# Patient Record
Sex: Female | Born: 1955 | Smoking: Current every day smoker
Health system: Southern US, Community
[De-identification: ages and names within clinical notes are randomized; demographics above are authoritative.]

## PROBLEM LIST (undated history)

## (undated) DIAGNOSIS — Z6826 Body mass index (BMI) 26.0-26.9, adult: Secondary | ICD-10-CM

## (undated) DIAGNOSIS — I050 Rheumatic mitral stenosis: Secondary | ICD-10-CM

## (undated) DIAGNOSIS — J189 Pneumonia, unspecified organism: Secondary | ICD-10-CM

## (undated) DIAGNOSIS — M1990 Unspecified osteoarthritis, unspecified site: Secondary | ICD-10-CM

## (undated) DIAGNOSIS — R519 Headache, unspecified: Secondary | ICD-10-CM

## (undated) DIAGNOSIS — E7800 Pure hypercholesterolemia, unspecified: Secondary | ICD-10-CM

## (undated) DIAGNOSIS — T8859XA Other complications of anesthesia, initial encounter: Secondary | ICD-10-CM

## (undated) DIAGNOSIS — R112 Nausea with vomiting, unspecified: Secondary | ICD-10-CM

## (undated) HISTORY — DX: Rheumatic mitral stenosis: I05.0

## (undated) HISTORY — DX: Pure hypercholesterolemia, unspecified: E78.00

## (undated) HISTORY — PX: TUBAL LIGATION: SHX77

## (undated) HISTORY — DX: Body mass index (BMI) 26.0-26.9, adult: Z68.26

## (undated) HISTORY — PX: CYST REMOVAL NECK: SHX6281

---

## 1981-05-15 DIAGNOSIS — H349 Unspecified retinal vascular occlusion: Secondary | ICD-10-CM

## 1981-05-15 HISTORY — DX: Unspecified retinal vascular occlusion: H34.9

## 1998-01-13 ENCOUNTER — Encounter: Payer: Self-pay | Admitting: Emergency Medicine

## 1998-01-13 ENCOUNTER — Emergency Department (HOSPITAL_COMMUNITY): Admission: EM | Admit: 1998-01-13 | Discharge: 1998-01-13 | Payer: Self-pay | Admitting: Emergency Medicine

## 1998-02-21 ENCOUNTER — Emergency Department (HOSPITAL_COMMUNITY): Admission: EM | Admit: 1998-02-21 | Discharge: 1998-02-21 | Payer: Self-pay | Admitting: Internal Medicine

## 2001-05-15 HISTORY — PX: OTHER SURGICAL HISTORY: SHX169

## 2001-10-03 ENCOUNTER — Inpatient Hospital Stay (HOSPITAL_COMMUNITY): Admission: AC | Admit: 2001-10-03 | Discharge: 2001-10-08 | Payer: Self-pay

## 2001-10-22 ENCOUNTER — Encounter: Payer: Self-pay | Admitting: Orthopedic Surgery

## 2001-10-22 ENCOUNTER — Encounter (INDEPENDENT_AMBULATORY_CARE_PROVIDER_SITE_OTHER): Payer: Self-pay | Admitting: Specialist

## 2001-10-22 ENCOUNTER — Ambulatory Visit (HOSPITAL_BASED_OUTPATIENT_CLINIC_OR_DEPARTMENT_OTHER): Admission: RE | Admit: 2001-10-22 | Discharge: 2001-10-22 | Payer: Self-pay | Admitting: Orthopedic Surgery

## 2002-03-17 ENCOUNTER — Encounter: Admission: RE | Admit: 2002-03-17 | Discharge: 2002-06-15 | Payer: Self-pay | Admitting: Orthopedic Surgery

## 2003-04-30 ENCOUNTER — Emergency Department (HOSPITAL_COMMUNITY): Admission: EM | Admit: 2003-04-30 | Discharge: 2003-04-30 | Payer: Self-pay | Admitting: Emergency Medicine

## 2003-12-13 ENCOUNTER — Emergency Department (HOSPITAL_COMMUNITY): Admission: EM | Admit: 2003-12-13 | Discharge: 2003-12-13 | Payer: Self-pay | Admitting: Emergency Medicine

## 2005-02-10 ENCOUNTER — Emergency Department (HOSPITAL_COMMUNITY): Admission: EM | Admit: 2005-02-10 | Discharge: 2005-02-10 | Payer: Self-pay | Admitting: Family Medicine

## 2009-04-02 ENCOUNTER — Emergency Department (HOSPITAL_COMMUNITY): Admission: EM | Admit: 2009-04-02 | Discharge: 2009-04-03 | Payer: Self-pay | Admitting: Emergency Medicine
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
## 2015-03-26 ENCOUNTER — Emergency Department (HOSPITAL_COMMUNITY)
Admission: EM | Admit: 2015-03-26 | Discharge: 2015-03-26 | Disposition: A | Discharge status: Home / Self Care | Payer: Self-pay | Attending: Emergency Medicine | Admitting: Emergency Medicine

## 2015-03-26 ENCOUNTER — Encounter (HOSPITAL_COMMUNITY): Payer: Self-pay | Admitting: Emergency Medicine

## 2015-03-26 ENCOUNTER — Emergency Department (HOSPITAL_COMMUNITY): Payer: Self-pay

## 2015-03-26 DIAGNOSIS — M7989 Other specified soft tissue disorders: Secondary | ICD-10-CM

## 2015-03-26 DIAGNOSIS — M79645 Pain in left finger(s): Secondary | ICD-10-CM

## 2015-03-26 DIAGNOSIS — R2232 Localized swelling, mass and lump, left upper limb: Secondary | ICD-10-CM | POA: Insufficient documentation

## 2015-03-26 DIAGNOSIS — Z8639 Personal history of other endocrine, nutritional and metabolic disease: Secondary | ICD-10-CM | POA: Insufficient documentation

## 2015-03-26 DIAGNOSIS — Z720 Tobacco use: Secondary | ICD-10-CM | POA: Insufficient documentation

## 2015-03-26 DIAGNOSIS — L539 Erythematous condition, unspecified: Secondary | ICD-10-CM | POA: Insufficient documentation

## 2015-03-26 HISTORY — DX: Pure hypercholesterolemia, unspecified: E78.00

## 2015-03-26 MED ORDER — TRAMADOL HCL 50 MG PO TABS: 50.0000 mg | ORAL_TABLET | Freq: Four times a day (QID) | ORAL | Status: DC | PRN

## 2015-03-26 MED ORDER — CEPHALEXIN 500 MG PO CAPS: 500.0000 mg | ORAL_CAPSULE | Freq: Four times a day (QID) | ORAL | Status: DC

## 2015-03-26 NOTE — Discharge Instructions (Signed)
Take the prescribed medication as directed. Monitor for signs of increased infection-- increased redness, swelling, streaking of arm, high fever, etc. Follow-up with your primary care physician in 2 days for re-check. Return to the ED for new or worsening symptoms.

## 2015-03-26 NOTE — ED Notes (Signed)
Patient states her L 4th finger felt like it "had to pop".   Patient states now the finger is sore and swelling.   Patient denies injury.

## 2015-03-26 NOTE — ED Provider Notes (Signed)
CSN: [id number]     Arrival date & time 03/26/15  0911 History  By signing my name below, I, Elizabeth Francis, attest that this documentation has been prepared under the direction and in the presence of non-physician practitioner, Quincy Carnes, PA-C. Electronically Signed: Evelene Francis, Scribe. 03/26/2015. 10:22 AM.  Chief Complaint  Patient presents with  . Hand Pain    The history is provided by the patient. No language interpreter was used.    HPI Comments:  Elizabeth Francis is a 59 y.o. female who presents to the Emergency Department complaining of moderate pain to her left ring finger with associated mild swelling and erythema at the site for 2 days. She denies injury, and h/o DM. No alleviating factors noted. Pt also denies fever. States finger feels like "it needs to pop".  Denies jam type injury.  Patient states she is concerned she may have been bitten by a spider but states she did not see anything directly bite her.  Patient is right hand dominant.  No hx of gout, DM.  Prior right hand surgeries, none on left.  VSS.  PCP: Helene Kelp in [place], [place] Hand Surgeon: Marcelino Scot  Past Medical History  Diagnosis Date  . Hypercholesterolemia    History reviewed. No pertinent past surgical history. No family history on file. Social History  Substance Use Topics  . Smoking status: Current Every Day Smoker -- 1.00 packs/day    Types: Cigarettes  . Smokeless tobacco: None  . Alcohol Use: No   OB History    No data available     Review of Systems  Constitutional: Negative for fever and chills.  Respiratory: Negative for shortness of breath.   Cardiovascular: Negative for chest pain.  Musculoskeletal: Positive for arthralgias.  Skin: Positive for color change (erythema).  All other systems reviewed and are negative.  Allergies  Review of patient's allergies indicates no known allergies.  Home Medications   Prior to Admission medications   Not on File   BP 118/64 mmHg  Pulse 77   Temp(Src) 98 F (36.7 C) (Oral)  Resp 18  Ht 5\' 1"  (1.549 m)  Wt 165 lb (74.844 kg)  BMI 31.19 kg/m2  SpO2 97%   Physical Exam  Constitutional: She is oriented to person, place, and time. She appears well-developed and well-nourished.  HENT:  Head: Normocephalic and atraumatic.  Mouth/Throat: Oropharynx is clear and moist.  Eyes: Conjunctivae and EOM are normal. Pupils are equal, round, and reactive to light.  Neck: Normal range of motion.  Cardiovascular: Normal rate, regular rhythm and normal heart sounds.   Pulmonary/Chest: Effort normal and breath sounds normal.  Musculoskeletal: Normal range of motion.       Left hand: She exhibits tenderness and swelling. She exhibits normal range of motion, normal two-point discrimination, normal capillary refill, no deformity and no laceration. Normal sensation noted. Normal strength noted.       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  Skin: Skin is warm and dry.  Psychiatric: She has a normal mood and affect.  Nursing note and vitals reviewed.  ED Course  Procedures   DIAGNOSTIC STUDIES:  Oxygen Saturation is 97% on RA, normal by my interpretation.    COORDINATION OF CARE:  10:17 AM Pt updated with XR results. Will discharge with antibiotics. Discussed treatment plan with pt at bedside and pt agreed to plan.  Labs Review Labs Reviewed - No data to display  Imaging Review Dg Finger Ring Left  03/26/2015  CLINICAL DATA:  Left ring finger pain and swelling. EXAM: LEFT RING FINGER 2+V COMPARISON:   None. FINDINGS: There is mild soft tissue swelling about the ring finger PIP joint. No acute fracture, dislocation, lytic or blastic osseous lesion, or radiopaque foreign body is seen. Joint space widths are preserved. Numerous surgical clips are partially visualized projecting over the thumb/thenar region on the lateral image. IMPRESSION: Soft tissue swelling about the left ring finger PIP joint without acute osseous abnormality identified. Electronically Signed   By: Logan Bores M.D.   On: 03/26/2015 10:08   I have personally reviewed and evaluated these images as part of my medical decision-making.    MDM   Final diagnoses:  Finger pain, left  Finger swelling   59 y.o. F with dorsal left 4th finger pain/swelling.  Denies injury, trauma, open wounds.  States she does have concern for insect bite, however this was not visualized and there are no bites on exam.  There is swelling, tenderness, and erythema of left fourth PIP joint along dorsal aspect. She is able to fully flex and extend finger without difficulty. There is no streaking of the finger or hand.  Patient afebrile, non-toxic.  X-ray negative for acute findings.  At this time do not suspect septic joint, infectious flexor tendosynovitis, or gout but she may be developing a superficial infection/cellulitis.  Will start on abx and tramadol.  Patient states she does not have the money to follow-up with her hand surgeon, Dr. Marcelino Scot, currently but she will have her PCP re-check finger on Monday.  Discussed plan with patient, he/she acknowledged understanding and agreed with plan of care.  Return precautions given for new or worsening symptoms.  I personally performed the services described in this documentation, which was scribed in my presence. The recorded information has been reviewed and is accurate.  Larene Pickett, PA-C 03/26/15 [place], MD 03/26/15 1925

## 2016-11-05 IMAGING — DX DG FINGER RING 2+V*L*
3 series · 3 of 3 positions shown · non-contrast
Comparison: None.

CLINICAL DATA: Left ring finger pain and swelling.

EXAM:
LEFT RING FINGER 2+V

[finger ap]
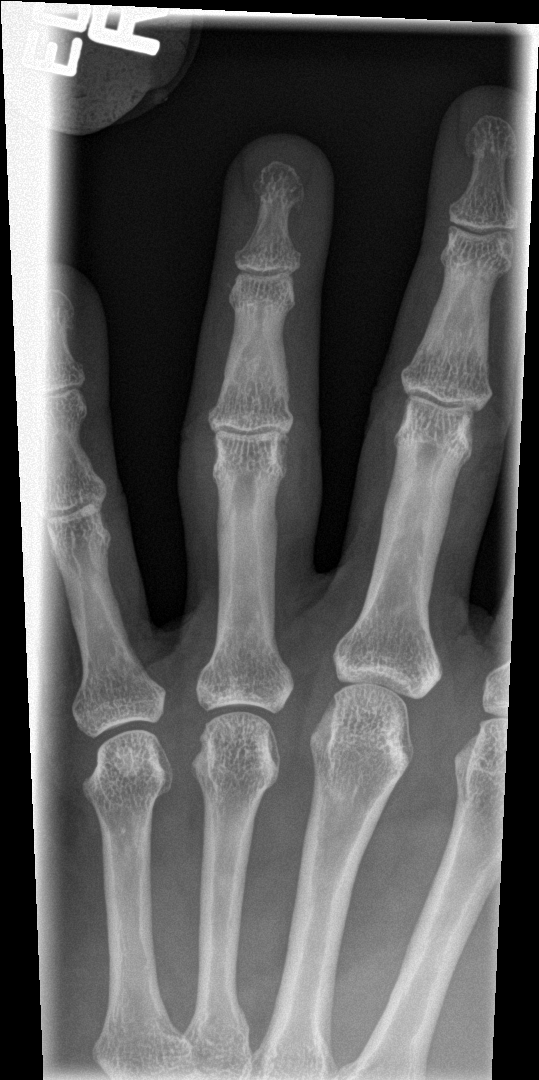

[finger obl]
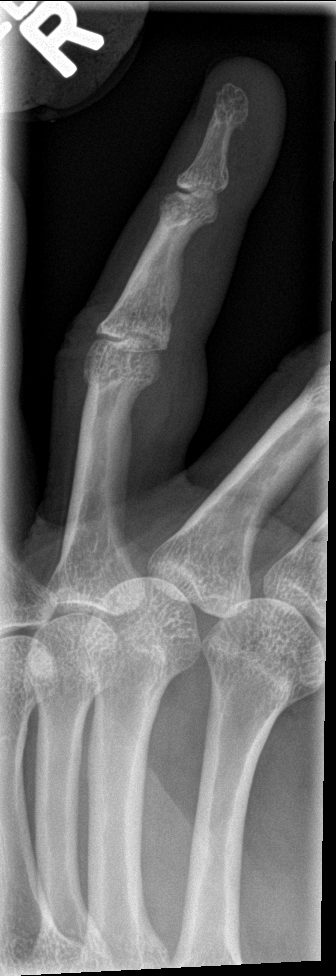

[finger lat]
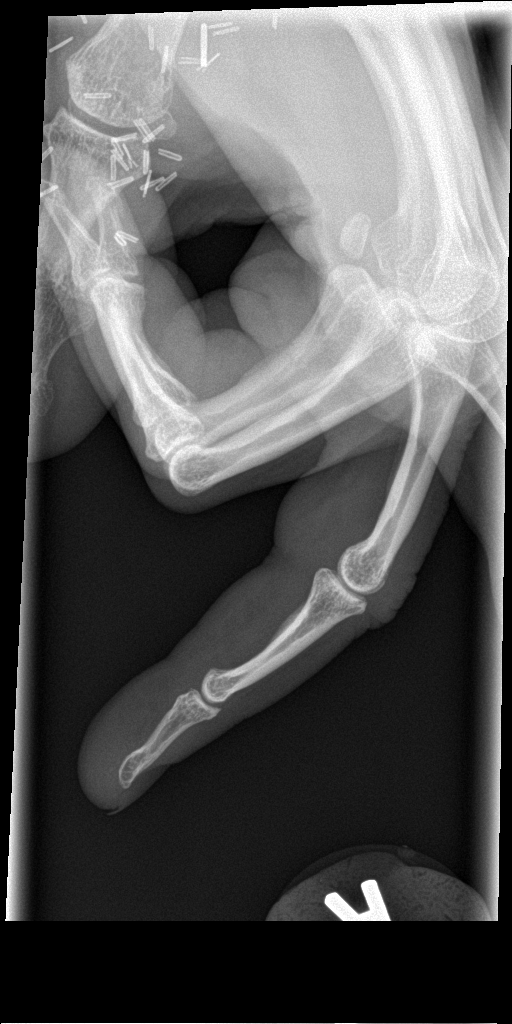

[3 of 3 positions shown; findings below may reference images not displayed]

FINDINGS: There is mild soft tissue swelling about the ring finger PIP joint.
No acute fracture, dislocation, lytic or blastic osseous lesion, or
radiopaque foreign body is seen. Joint space widths are preserved.
Numerous surgical clips are partially visualized projecting over the
thumb/thenar region on the lateral image.
IMPRESSION: Soft tissue swelling about the left ring finger PIP joint without
acute osseous abnormality identified.

## 2021-05-15 HISTORY — PX: PARTIAL KNEE ARTHROPLASTY: SHX2174

## 2021-09-22 DIAGNOSIS — E7800 Pure hypercholesterolemia, unspecified: Secondary | ICD-10-CM | POA: Diagnosis not present

## 2021-09-22 DIAGNOSIS — M899 Disorder of bone, unspecified: Secondary | ICD-10-CM | POA: Diagnosis not present

## 2021-09-22 DIAGNOSIS — M8580 Other specified disorders of bone density and structure, unspecified site: Secondary | ICD-10-CM | POA: Diagnosis not present

## 2021-09-22 DIAGNOSIS — M25562 Pain in left knee: Secondary | ICD-10-CM | POA: Diagnosis not present

## 2021-09-22 DIAGNOSIS — Z0000 Encounter for general adult medical examination without abnormal findings: Secondary | ICD-10-CM | POA: Diagnosis not present

## 2021-09-22 DIAGNOSIS — M8589 Other specified disorders of bone density and structure, multiple sites: Secondary | ICD-10-CM | POA: Diagnosis not present

## 2021-09-22 DIAGNOSIS — Z6826 Body mass index (BMI) 26.0-26.9, adult: Secondary | ICD-10-CM | POA: Diagnosis not present

## 2021-10-13 DIAGNOSIS — M1711 Unilateral primary osteoarthritis, right knee: Secondary | ICD-10-CM | POA: Diagnosis not present

## 2021-10-13 DIAGNOSIS — M25562 Pain in left knee: Secondary | ICD-10-CM | POA: Diagnosis not present

## 2021-12-13 DIAGNOSIS — Z6826 Body mass index (BMI) 26.0-26.9, adult: Secondary | ICD-10-CM | POA: Diagnosis not present

## 2021-12-13 DIAGNOSIS — S0502XA Injury of conjunctiva and corneal abrasion without foreign body, left eye, initial encounter: Secondary | ICD-10-CM | POA: Diagnosis not present

## 2021-12-19 DIAGNOSIS — H40812 Glaucoma with increased episcleral venous pressure, left eye: Secondary | ICD-10-CM | POA: Diagnosis not present

## 2021-12-19 DIAGNOSIS — H02825 Cysts of left lower eyelid: Secondary | ICD-10-CM | POA: Diagnosis not present

## 2022-02-02 DIAGNOSIS — H02825 Cysts of left lower eyelid: Secondary | ICD-10-CM | POA: Diagnosis not present

## 2022-02-02 DIAGNOSIS — H2513 Age-related nuclear cataract, bilateral: Secondary | ICD-10-CM | POA: Diagnosis not present

## 2022-02-02 DIAGNOSIS — H43812 Vitreous degeneration, left eye: Secondary | ICD-10-CM | POA: Diagnosis not present

## 2022-03-06 DIAGNOSIS — R252 Cramp and spasm: Secondary | ICD-10-CM | POA: Diagnosis not present

## 2022-03-06 DIAGNOSIS — Z6826 Body mass index (BMI) 26.0-26.9, adult: Secondary | ICD-10-CM | POA: Diagnosis not present

## 2022-03-21 DIAGNOSIS — D485 Neoplasm of uncertain behavior of skin: Secondary | ICD-10-CM | POA: Diagnosis not present

## 2022-03-21 DIAGNOSIS — H04203 Unspecified epiphora, bilateral lacrimal glands: Secondary | ICD-10-CM | POA: Diagnosis not present

## 2022-03-22 DIAGNOSIS — D23122 Other benign neoplasm of skin of left lower eyelid, including canthus: Secondary | ICD-10-CM | POA: Diagnosis not present

## 2022-03-22 DIAGNOSIS — D23112 Other benign neoplasm of skin of right lower eyelid, including canthus: Secondary | ICD-10-CM | POA: Diagnosis not present

## 2022-04-12 DIAGNOSIS — R519 Headache, unspecified: Secondary | ICD-10-CM | POA: Diagnosis not present

## 2022-04-12 DIAGNOSIS — J019 Acute sinusitis, unspecified: Secondary | ICD-10-CM | POA: Diagnosis not present

## 2022-04-12 DIAGNOSIS — R059 Cough, unspecified: Secondary | ICD-10-CM | POA: Diagnosis not present

## 2022-04-12 DIAGNOSIS — J209 Acute bronchitis, unspecified: Secondary | ICD-10-CM | POA: Diagnosis not present

## 2022-04-13 DIAGNOSIS — R079 Chest pain, unspecified: Secondary | ICD-10-CM | POA: Diagnosis not present

## 2022-04-13 DIAGNOSIS — J189 Pneumonia, unspecified organism: Secondary | ICD-10-CM | POA: Diagnosis not present

## 2022-04-13 DIAGNOSIS — F17210 Nicotine dependence, cigarettes, uncomplicated: Secondary | ICD-10-CM | POA: Diagnosis not present

## 2022-04-13 DIAGNOSIS — Z8673 Personal history of transient ischemic attack (TIA), and cerebral infarction without residual deficits: Secondary | ICD-10-CM | POA: Diagnosis not present

## 2022-04-20 DIAGNOSIS — J189 Pneumonia, unspecified organism: Secondary | ICD-10-CM | POA: Diagnosis not present

## 2022-04-20 DIAGNOSIS — Z6826 Body mass index (BMI) 26.0-26.9, adult: Secondary | ICD-10-CM | POA: Diagnosis not present

## 2022-05-30 DIAGNOSIS — M1712 Unilateral primary osteoarthritis, left knee: Secondary | ICD-10-CM | POA: Diagnosis not present

## 2022-05-30 DIAGNOSIS — M21162 Varus deformity, not elsewhere classified, left knee: Secondary | ICD-10-CM | POA: Diagnosis not present

## 2022-06-06 DIAGNOSIS — Z01812 Encounter for preprocedural laboratory examination: Secondary | ICD-10-CM | POA: Diagnosis not present

## 2022-06-06 DIAGNOSIS — Z419 Encounter for procedure for purposes other than remedying health state, unspecified: Secondary | ICD-10-CM | POA: Diagnosis not present

## 2022-06-13 DIAGNOSIS — M1712 Unilateral primary osteoarthritis, left knee: Secondary | ICD-10-CM | POA: Diagnosis not present

## 2022-06-14 DIAGNOSIS — M1712 Unilateral primary osteoarthritis, left knee: Secondary | ICD-10-CM | POA: Diagnosis not present

## 2022-06-14 DIAGNOSIS — G8918 Other acute postprocedural pain: Secondary | ICD-10-CM | POA: Diagnosis not present

## 2022-06-14 DIAGNOSIS — M94262 Chondromalacia, left knee: Secondary | ICD-10-CM | POA: Diagnosis not present

## 2022-06-22 DIAGNOSIS — Z96652 Presence of left artificial knee joint: Secondary | ICD-10-CM | POA: Diagnosis not present

## 2022-06-23 DIAGNOSIS — M25562 Pain in left knee: Secondary | ICD-10-CM | POA: Diagnosis not present

## 2022-06-26 DIAGNOSIS — M25562 Pain in left knee: Secondary | ICD-10-CM | POA: Diagnosis not present

## 2022-06-29 DIAGNOSIS — M25562 Pain in left knee: Secondary | ICD-10-CM | POA: Diagnosis not present

## 2022-07-03 DIAGNOSIS — M25562 Pain in left knee: Secondary | ICD-10-CM | POA: Diagnosis not present

## 2022-07-06 DIAGNOSIS — M25562 Pain in left knee: Secondary | ICD-10-CM | POA: Diagnosis not present

## 2022-07-12 DIAGNOSIS — M25562 Pain in left knee: Secondary | ICD-10-CM | POA: Diagnosis not present

## 2022-07-14 DIAGNOSIS — M25562 Pain in left knee: Secondary | ICD-10-CM | POA: Diagnosis not present

## 2022-07-17 DIAGNOSIS — M25562 Pain in left knee: Secondary | ICD-10-CM | POA: Diagnosis not present

## 2022-07-21 DIAGNOSIS — M25562 Pain in left knee: Secondary | ICD-10-CM | POA: Diagnosis not present

## 2022-07-24 DIAGNOSIS — M25562 Pain in left knee: Secondary | ICD-10-CM | POA: Diagnosis not present

## 2022-07-26 DIAGNOSIS — M25562 Pain in left knee: Secondary | ICD-10-CM | POA: Diagnosis not present

## 2022-08-24 DIAGNOSIS — H6092 Unspecified otitis externa, left ear: Secondary | ICD-10-CM | POA: Diagnosis not present

## 2022-08-24 DIAGNOSIS — Z6826 Body mass index (BMI) 26.0-26.9, adult: Secondary | ICD-10-CM | POA: Diagnosis not present

## 2023-01-11 DIAGNOSIS — Z6826 Body mass index (BMI) 26.0-26.9, adult: Secondary | ICD-10-CM | POA: Diagnosis not present

## 2023-01-11 DIAGNOSIS — S40812A Abrasion of left upper arm, initial encounter: Secondary | ICD-10-CM | POA: Diagnosis not present

## 2023-01-31 DIAGNOSIS — Z6826 Body mass index (BMI) 26.0-26.9, adult: Secondary | ICD-10-CM | POA: Diagnosis not present

## 2023-01-31 DIAGNOSIS — J329 Chronic sinusitis, unspecified: Secondary | ICD-10-CM | POA: Diagnosis not present

## 2023-03-12 DIAGNOSIS — R21 Rash and other nonspecific skin eruption: Secondary | ICD-10-CM | POA: Diagnosis not present

## 2023-03-12 DIAGNOSIS — L259 Unspecified contact dermatitis, unspecified cause: Secondary | ICD-10-CM | POA: Diagnosis not present

## 2023-04-02 DIAGNOSIS — R1084 Generalized abdominal pain: Secondary | ICD-10-CM | POA: Diagnosis not present

## 2023-04-02 DIAGNOSIS — R1110 Vomiting, unspecified: Secondary | ICD-10-CM | POA: Diagnosis not present

## 2023-06-21 DIAGNOSIS — J40 Bronchitis, not specified as acute or chronic: Secondary | ICD-10-CM | POA: Diagnosis not present

## 2023-06-21 DIAGNOSIS — J329 Chronic sinusitis, unspecified: Secondary | ICD-10-CM | POA: Diagnosis not present

## 2023-07-12 DIAGNOSIS — Z96652 Presence of left artificial knee joint: Secondary | ICD-10-CM | POA: Diagnosis not present

## 2023-11-21 DIAGNOSIS — M549 Dorsalgia, unspecified: Secondary | ICD-10-CM | POA: Diagnosis not present

## 2023-12-05 DIAGNOSIS — M898X1 Other specified disorders of bone, shoulder: Secondary | ICD-10-CM | POA: Diagnosis not present

## 2023-12-05 DIAGNOSIS — M546 Pain in thoracic spine: Secondary | ICD-10-CM | POA: Diagnosis not present

## 2023-12-05 DIAGNOSIS — G8929 Other chronic pain: Secondary | ICD-10-CM | POA: Diagnosis not present

## 2023-12-11 DIAGNOSIS — G8929 Other chronic pain: Secondary | ICD-10-CM | POA: Diagnosis not present

## 2023-12-11 DIAGNOSIS — S43394D Dislocation of other parts of right shoulder girdle, subsequent encounter: Secondary | ICD-10-CM | POA: Diagnosis not present

## 2023-12-11 DIAGNOSIS — M899 Disorder of bone, unspecified: Secondary | ICD-10-CM | POA: Diagnosis not present

## 2023-12-11 DIAGNOSIS — M545 Low back pain, unspecified: Secondary | ICD-10-CM | POA: Diagnosis not present

## 2024-01-01 DIAGNOSIS — M7989 Other specified soft tissue disorders: Secondary | ICD-10-CM | POA: Diagnosis not present

## 2024-01-01 DIAGNOSIS — Z6826 Body mass index (BMI) 26.0-26.9, adult: Secondary | ICD-10-CM | POA: Diagnosis not present

## 2024-01-01 DIAGNOSIS — L609 Nail disorder, unspecified: Secondary | ICD-10-CM | POA: Diagnosis not present

## 2024-01-07 DIAGNOSIS — M545 Low back pain, unspecified: Secondary | ICD-10-CM | POA: Diagnosis not present

## 2024-01-07 DIAGNOSIS — Z5181 Encounter for therapeutic drug level monitoring: Secondary | ICD-10-CM | POA: Diagnosis not present

## 2024-01-07 DIAGNOSIS — G8929 Other chronic pain: Secondary | ICD-10-CM | POA: Diagnosis not present

## 2024-03-05 DIAGNOSIS — Z1339 Encounter for screening examination for other mental health and behavioral disorders: Secondary | ICD-10-CM | POA: Diagnosis not present

## 2024-03-05 DIAGNOSIS — Z2821 Immunization not carried out because of patient refusal: Secondary | ICD-10-CM | POA: Diagnosis not present

## 2024-03-05 DIAGNOSIS — E7800 Pure hypercholesterolemia, unspecified: Secondary | ICD-10-CM | POA: Diagnosis not present

## 2024-03-05 DIAGNOSIS — Z0000 Encounter for general adult medical examination without abnormal findings: Secondary | ICD-10-CM | POA: Diagnosis not present

## 2024-03-05 DIAGNOSIS — Z6826 Body mass index (BMI) 26.0-26.9, adult: Secondary | ICD-10-CM | POA: Diagnosis not present

## 2024-03-05 DIAGNOSIS — I058 Other rheumatic mitral valve diseases: Secondary | ICD-10-CM | POA: Diagnosis not present

## 2024-03-05 DIAGNOSIS — Z1331 Encounter for screening for depression: Secondary | ICD-10-CM | POA: Diagnosis not present

## 2024-03-05 DIAGNOSIS — Z79899 Other long term (current) drug therapy: Secondary | ICD-10-CM | POA: Diagnosis not present

## 2024-03-07 DIAGNOSIS — K219 Gastro-esophageal reflux disease without esophagitis: Secondary | ICD-10-CM | POA: Diagnosis not present

## 2024-03-07 DIAGNOSIS — I2510 Atherosclerotic heart disease of native coronary artery without angina pectoris: Secondary | ICD-10-CM | POA: Diagnosis not present

## 2024-03-07 DIAGNOSIS — R32 Unspecified urinary incontinence: Secondary | ICD-10-CM | POA: Diagnosis not present

## 2024-03-07 DIAGNOSIS — M1990 Unspecified osteoarthritis, unspecified site: Secondary | ICD-10-CM | POA: Diagnosis not present

## 2024-03-07 DIAGNOSIS — K08409 Partial loss of teeth, unspecified cause, unspecified class: Secondary | ICD-10-CM | POA: Diagnosis not present

## 2024-03-07 DIAGNOSIS — R030 Elevated blood-pressure reading, without diagnosis of hypertension: Secondary | ICD-10-CM | POA: Diagnosis not present

## 2024-03-07 DIAGNOSIS — E785 Hyperlipidemia, unspecified: Secondary | ICD-10-CM | POA: Diagnosis not present

## 2024-03-07 DIAGNOSIS — M545 Low back pain, unspecified: Secondary | ICD-10-CM | POA: Diagnosis not present

## 2024-03-07 DIAGNOSIS — F325 Major depressive disorder, single episode, in full remission: Secondary | ICD-10-CM | POA: Diagnosis not present

## 2024-03-10 ENCOUNTER — Other Ambulatory Visit: Payer: Self-pay

## 2024-03-10 DIAGNOSIS — Z6826 Body mass index (BMI) 26.0-26.9, adult: Secondary | ICD-10-CM | POA: Insufficient documentation

## 2024-03-10 DIAGNOSIS — I050 Rheumatic mitral stenosis: Secondary | ICD-10-CM | POA: Insufficient documentation

## 2024-03-10 DIAGNOSIS — E7800 Pure hypercholesterolemia, unspecified: Secondary | ICD-10-CM | POA: Insufficient documentation

## 2024-03-11 ENCOUNTER — Ambulatory Visit

## 2024-03-11 VITALS — BP 128/74 | HR 102 | Ht 60.0 in | Wt 142.8 lb

## 2024-03-11 DIAGNOSIS — E7800 Pure hypercholesterolemia, unspecified: Secondary | ICD-10-CM | POA: Diagnosis not present

## 2024-03-11 DIAGNOSIS — I050 Rheumatic mitral stenosis: Secondary | ICD-10-CM | POA: Diagnosis not present

## 2024-03-11 DIAGNOSIS — R072 Precordial pain: Secondary | ICD-10-CM | POA: Diagnosis not present

## 2024-03-11 DIAGNOSIS — I700 Atherosclerosis of aorta: Secondary | ICD-10-CM | POA: Insufficient documentation

## 2024-03-11 DIAGNOSIS — R0609 Other forms of dyspnea: Secondary | ICD-10-CM | POA: Insufficient documentation

## 2024-03-11 MED ORDER — METOPROLOL TARTRATE 100 MG PO TABS: 100.0000 mg | ORAL_TABLET | Freq: Once | ORAL | 0 refills | Status: DC

## 2024-03-11 NOTE — Assessment & Plan Note (Signed)
 Referral was sent in as mitral valve disorder however chest x-ray was more noticeable for aortic atherosclerosis and there was no significant abnormality or findings related to mitral valve active reviewed the x-ray findings.  There is no formal report linked to the image.  Will be obtaining transthoracic echocardiogram to assess her dyspnea on exertion symptoms and will assess valve function on that.

## 2024-03-11 NOTE — Patient Instructions (Signed)
 Medication Instructions:  Your physician recommends that you continue on your current medications as directed. Please refer to the Current Medication list given to you today.  *If you need a refill on your cardiac medications before your next appointment, please call your pharmacy*   Lab Work: None ordered If you have labs (blood work) drawn today and your tests are completely normal, you will receive your results only by: MyChart Message (if you have MyChart) OR A paper copy in the mail If you have any lab test that is abnormal or we need to change your treatment, we will call you to review the results.  Testing/Procedures: Your physician has requested that you have an echocardiogram. Echocardiography is a painless test that uses sound waves to create images of your heart. It provides your doctor with information about the size and shape of your heart and how well your heart's chambers and valves are working. This procedure takes approximately one hour. There are no restrictions for this procedure. Please do NOT wear cologne, perfume, aftershave, or lotions (deodorant is allowed). Please arrive 15 minutes prior to your appointment time.  Please note: We ask at that you not bring children with you during ultrasound (echo/ vascular) testing. Due to room size and safety concerns, children are not allowed in the ultrasound rooms during exams. Our front office staff cannot provide observation of children in our lobby area while testing is being conducted. An adult accompanying a patient to their appointment will only be allowed in the ultrasound room at the discretion of the ultrasound technician under special circumstances. We apologize for any inconvenience.    Your cardiac CT will be scheduled at one of the below locations:   [facility] [address] [place], [place] [phone]  Please follow these instructions carefully (unless otherwise directed):  An IV will be required for  this test and Nitroglycerin will be given.   On the Night Before the Test: Be sure to Drink plenty of water. Do not consume any caffeinated/decaffeinated beverages or chocolate 12 hours prior to your test. Do not take any antihistamines 12 hours prior to your test.  On the Day of the Test: Drink plenty of water until 1 hour prior to the test. Do not eat any food 1 hour prior to test. You may take your regular medications prior to the test.  Take metoprolol (Lopressor) 100 mg two hours prior to test. This is a one time dose. FEMALES- please wear underwire-free bra if available, avoid dresses & tight clothing      After the Test: Drink plenty of water. After receiving IV contrast, you may experience a mild flushed feeling. This is normal. On occasion, you may experience a mild rash up to 24 hours after the test. This is not dangerous. If this occurs, you can take Benadryl 25 mg, Zyrtec, Claritin, or Allegra and increase your fluid intake. (Patients taking Tikosyn should avoid Benadryl, and may take Zyrtec, Claritin, or Allegra) If you experience trouble breathing, this can be serious. If it is severe call 911 IMMEDIATELY. If it is mild, please call our office.  We will call to schedule your test 2-4 weeks out understanding that some insurance companies will need an authorization prior to the service being performed.   For more information and frequently asked questions, please visit our website : [email or web address]  For non-scheduling related questions, please contact the cardiac imaging nurse navigator should you have any questions/concerns: Cardiac Imaging Nurse Navigators Direct Office Dial: [phone]  For scheduling needs, including cancellations and rescheduling, please call Brittany, [phone].  Follow-Up: At [facility], you and your health needs are our priority.  As part of our continuing mission to provide you with exceptional heart care, we have  created designated Provider Care Teams.  These Care Teams include your primary Cardiologist (physician) and Advanced Practice Providers (APPs -  Physician Assistants and Nurse Practitioners) who all work together to provide you with the care you need, when you need it.  We recommend signing up for the patient portal called MyChart.  Sign up information is provided on this After Visit Summary.  MyChart is used to connect with patients for Virtual Visits (Telemedicine).  Patients are able to view lab/test results, encounter notes, upcoming appointments, etc.  Non-urgent messages can be sent to your provider as well.   To learn more about what you can do with MyChart, go to [email or web address].    Your next appointment:   2 month(s)  The format for your next appointment:   In Person  Provider:   Alean Madireddy, MD   Other Instructions Echocardiogram An echocardiogram is a test that uses sound waves (ultrasound) to produce images of the heart. Images from an echocardiogram can provide important information about: Heart size and shape. The size and thickness and movement of your heart's walls. Heart muscle function and strength. Heart valve function or if you have stenosis. Stenosis is when the heart valves are too narrow. If blood is flowing backward through the heart valves (regurgitation). A tumor or infectious growth around the heart valves. Areas of heart muscle that are not working well because of poor blood flow or injury from a heart attack. Aneurysm detection. An aneurysm is a weak or damaged part of an artery wall. The wall bulges out from the normal force of blood pumping through the body. Tell a health care provider about: Any allergies you have. All medicines you are taking, including vitamins, herbs, eye drops, creams, and over-the-counter medicines. Any blood disorders you have. Any surgeries you have had. Any medical conditions you have. Whether you are pregnant  or may be pregnant. What are the risks? Generally, this is a safe test. However, problems may occur, including an allergic reaction to dye (contrast) that may be used during the test. What happens before the test? No specific preparation is needed. You may eat and drink normally. What happens during the test? You will take off your clothes from the waist up and put on a hospital gown. Electrodes or electrocardiogram (ECG)patches may be placed on your chest. The electrodes or patches are then connected to a device that monitors your heart rate and rhythm. You will lie down on a table for an ultrasound exam. A gel will be applied to your chest to help sound waves pass through your skin. A handheld device, called a transducer, will be pressed against your chest and moved over your heart. The transducer produces sound waves that travel to your heart and bounce back (or echo back) to the transducer. These sound waves will be captured in real-time and changed into images of your heart that can be viewed on a video monitor. The images will be recorded on a computer and reviewed by your health care provider. You may be asked to change positions or hold your breath for a short time. This makes it easier to get different views or better views of your heart. In some cases, you may receive contrast through an IV in one of  your veins. This can improve the quality of the pictures from your heart. The procedure may vary among health care providers and hospitals.   What can I expect after the test? You may return to your normal, everyday life, including diet, activities, and medicines, unless your health care provider tells you not to do that. Follow these instructions at home: It is up to you to get the results of your test. Ask your health care provider, or the department that is doing the test, when your results will be ready. Keep all follow-up visits. This is important. Summary An echocardiogram is a test  that uses sound waves (ultrasound) to produce images of the heart. Images from an echocardiogram can provide important information about the size and shape of your heart, heart muscle function, heart valve function, and other possible heart problems. You do not need to do anything to prepare before this test. You may eat and drink normally. After the echocardiogram is completed, you may return to your normal, everyday life, unless your health care provider tells you not to do that. This information is not intended to replace advice given to you by your health care provider. Make sure you discuss any questions you have with your health care provider. Document Revised: 12/23/2019 Document Reviewed: 12/23/2019 Elsevier Patient Education  2021 [employer].  Cardiac CT Angiogram A cardiac CT angiogram is a procedure to look at the heart and the area around the heart. It may be done to help find the cause of chest pains or other symptoms of heart disease. During this procedure, a substance called contrast dye is injected into a vein in the arm. The contrast highlights the blood vessels in the area to be checked. A large X-ray machine (CT scanner), then takes detailed pictures of the heart and the surrounding area. The procedure is also sometimes called a coronary CT angiogram, coronary artery scanning, or CTA. A cardiac CT angiogram allows the health care provider to see how well blood is flowing to and from the heart. The provider will be able to see if there are any problems, such as: Blockage or narrowing of the arteries in the heart. Fluid around the heart. Signs of weakness or disease in the muscles, valves, and tissues of the heart. Tell a health care provider about: Any allergies you have. This is especially important if you have had a previous allergic reaction to medicines, contrast dye, or iodine. All medicines you are taking, including vitamins, herbs, eye drops, creams, and over-the-counter  medicines. Any bleeding problems you have. Any surgeries you have had. Any medical conditions you have, including kidney problems or kidney failure. Whether you are pregnant or may be pregnant. Any anxiety disorders, chronic pain, or other conditions you have. These may increase your stress or prevent you from lying still. Any history of abnormal heart rhythms or heart procedures. What are the risks? Your provider will talk with you about risks. These may include: Bleeding. Infection. Allergic reactions to medicines or dyes. Damage to other structures or organs. Kidney damage from the contrast dye. Increased risk of cancer from radiation exposure. This risk is low. Talk with your provider about: The risks and benefits of testing. How you can receive the lowest dose of radiation. What happens before the procedure? Wear comfortable clothing and remove any jewelry, glasses, dentures, and hearing aids. Follow instructions from your provider about eating and drinking. These may include: 12 hours before the procedure Avoid caffeine. This includes tea, coffee, soda, energy  drinks, and diet pills. Drink plenty of water or other fluids that do not have caffeine in them. Being well hydrated can prevent complications. 4-6 hours before the procedure Stop eating and drinking. This will reduce the risk of nausea from the contrast dye. Ask your provider about changing or stopping your regular medicines. These include: Diabetes medicines. Medicines to treat problems with erections (erectile dysfunction). If you have kidney problems, you may need to receive IV hydration before and after the test. What happens during the procedure?  Hair on your chest may need to be removed so that small sticky patches called electrodes can be placed on your chest. These will transmit information that helps to monitor your heart during the procedure. An IV will be inserted into one of your veins. You might be given a  medicine to control your heart rate during the procedure. This will help to ensure that good images are obtained. You will be asked to lie on an exam table. This table will slide in and out of the CT machine during the procedure. Contrast dye will be injected into the IV. You might feel warm, or you may get a metallic taste in your mouth. You may be given medicines to relax or dilate the arteries in your heart. If you are allergic to contrast dyes or iodine you may be given medicine before the test to reduce the risk of an allergic reaction. The table that you are lying on will move into the CT machine tunnel for the scan. The person running the machine will give you instructions while the scans are being done. You may be asked to: Keep your arms above your head. Hold your breath for short periods. Stay very still, even if the table is moving. The procedure may vary among providers and hospitals. What can I expect after the procedure? After your procedure, it is common to have: A metallic taste in your mouth from the contrast dye. A feeling of warmth. A headache from the heart medicine. Follow these instructions at home: Take over-the-counter and prescription medicines only as told by your provider. If you are told, drink enough fluid to keep your pee pale yellow. This will help to flush the contrast dye out of your body. Most people can return to their normal activities right after the procedure. Ask your provider what activities are safe for you. It is up to you to get the results of your procedure. Ask your provider, or the department that is doing the procedure, when your results will be ready. Contact a health care provider if: You have any symptoms of allergy to the contrast dye. These include: Shortness of breath. Rash or hives. A racing heartbeat. You notice a change in your peeing (urination). This information is not intended to replace advice given to you by your health care  provider. Make sure you discuss any questions you have with your health care provider. Document Revised: 12/02/2021 Document Reviewed: 12/02/2021 Elsevier Patient Education  2024 Else[employer] Inc[employer] Important Information About Sugar

## 2024-03-11 NOTE — Assessment & Plan Note (Signed)
 Symptoms of dyspnea on exertion, with her underlying history of cardiac risk factors in the form of age, family history, smoking and aortic atherosclerosis noted on x-ray imaging.  Symptoms reported by her prior likely anginal equivalent. Differential diagnosis would be deconditioning and pulmonary causes.  From cardiac standpoint will further evaluate with transthoracic echocardiogram to assess cardiac structure and function. Will also obtain cardiac CT coronary angiogram to rule out any obstructive coronary artery disease and assess burden of coronary atherosclerosis.  Discussed the test with her and she is agreeable.
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
## 2024-03-11 NOTE — Assessment & Plan Note (Signed)
 Incidentally identified on x-rays done to evaluate her upper back pain at [facility] 11/21/2023. Was referred for further cardiac evaluation.  She has nonspecific symptoms of dyspnea on exertion associated with chest pressure which could be an anginal equivalent. Has significant cardiac risk factors.  Reviewed further under dyspnea on exertion and chest pressure on exertion.  Discussed the significance of plaque buildup and aortic atherosclerosis in terms of overall cardiovascular risk.  At healthy diet and lifestyle modifications. Role of aspirin and statins in this setting for reducing overall cardiovascular risk were discussed. She currently wishes to hold off on aspirin until further cardiac evaluation. Has started taking atorvastatin low-dose 10 mg once daily recently.  Will titrate up the dose as needed based on cardiac CT results.

## 2024-03-11 NOTE — Assessment & Plan Note (Signed)
 Abnormal lipid panel 03/05/2024 Total cholesterol 275, triglycerides 136, HDL 55 and LDL 193.  Recently started atorvastatin 10 mg once daily. For the last 2 days has been tolerating well. Continue the same. Anticipate requiring to titrate up the dose as she does have evidence of aortic atherosclerosis and would recommend LDL target below 70 mg/dL.  She wishes to hold off on further cardiac evaluation prior to making adjustments.  This is reasonable.

## 2024-04-03 ENCOUNTER — Ambulatory Visit

## 2024-04-03 ENCOUNTER — Telehealth: Payer: Self-pay

## 2024-04-03 DIAGNOSIS — R0609 Other forms of dyspnea: Secondary | ICD-10-CM

## 2024-04-03 DIAGNOSIS — R072 Precordial pain: Secondary | ICD-10-CM

## 2024-04-03 NOTE — Telephone Encounter (Signed)
 Patient states the medication that she needed before her CT scan had not been sent into the pharmacy. [facility] uses [facility] [place]. CB # [phone]

## 2024-04-04 ENCOUNTER — Ambulatory Visit: Payer: Self-pay

## 2024-04-04 DIAGNOSIS — I25118 Atherosclerotic heart disease of native coronary artery with other forms of angina pectoris: Secondary | ICD-10-CM

## 2024-04-04 LAB — ECHOCARDIOGRAM COMPLETE
AR max vel: 2.1 cm2
AV Area VTI: 2.05 cm2
AV Area mean vel: 2 cm2
AV Mean grad: 3.5 mmHg
AV Peak grad: 6.2 mmHg
Ao pk vel: 1.24 m/s
Area-P 1/2: 1.96 cm2
MV VTI: 1.4 cm2
S' Lateral: 2.8 cm

## 2024-04-04 MED ORDER — METOPROLOL TARTRATE 100 MG PO TABS: 100.0000 mg | ORAL_TABLET | Freq: Once | ORAL | 0 refills | Status: DC

## 2024-04-04 NOTE — Telephone Encounter (Signed)
 RX has been resent as it was sent 03/11/24 to [facility] [place].

## 2024-04-07 MED ORDER — METOPROLOL TARTRATE 100 MG PO TABS: 100.0000 mg | ORAL_TABLET | Freq: Once | ORAL | 0 refills | Status: DC

## 2024-04-07 NOTE — Telephone Encounter (Signed)
-----   Message from [place] R Madireddy sent at 04/04/2024  5:29 PM EST ----- Please inform her results on the echocardiogram study show normal pumping and relaxation function of the heart. No major valve abnormalities. This is good news and reassuring. Will review cardiac CT coronary angiogram results once completed and available. Please forward results to PCP. Thank you  ----- Message ----- From: Interface, [employer] Sent: 04/04/2024   5:24 PM EST To: Alean SAUNDERS Madireddy, MD

## 2024-04-07 NOTE — Telephone Encounter (Signed)
 Results reviewed with pt as per Dr. Madireddy's note.  Pt verbalized understanding and had no additional questions. Routed to PCP.

## 2024-04-16 ENCOUNTER — Telehealth (HOSPITAL_COMMUNITY): Payer: Self-pay | Admitting: Emergency Medicine

## 2024-04-16 NOTE — Telephone Encounter (Signed)
 Reaching out to patient to offer assistance regarding upcoming cardiac imaging study; pt verbalizes understanding of appt date/time, parking situation and where to check in, pre-test NPO status and medications ordered, and verified current allergies; name and call back number provided for further questions should they arise Rockwell Alexandria RN Navigator Cardiac Imaging [facility] [phone] office [phone] cell

## 2024-04-17 ENCOUNTER — Ambulatory Visit (INDEPENDENT_AMBULATORY_CARE_PROVIDER_SITE_OTHER)
Admission: RE | Admit: 2024-04-17 | Discharge: 2024-04-17 | Disposition: A | Source: Ambulatory Visit | Admitting: Radiology

## 2024-04-17 DIAGNOSIS — R072 Precordial pain: Secondary | ICD-10-CM

## 2024-04-17 DIAGNOSIS — R0609 Other forms of dyspnea: Secondary | ICD-10-CM

## 2024-04-17 MED ORDER — IOHEXOL 350 MG/ML SOLN
95.0000 mL | Freq: Once | INTRAVENOUS | Status: AC | PRN
  Administered 2024-04-17: 95 mL via INTRAVENOUS

## 2024-04-17 MED ORDER — NITROGLYCERIN 0.4 MG SL SUBL
0.8000 mg | SUBLINGUAL_TABLET | Freq: Once | SUBLINGUAL | Status: AC
  Administered 2024-04-17: 0.8 mg via SUBLINGUAL

## 2024-04-20 ENCOUNTER — Other Ambulatory Visit: Payer: Self-pay | Admitting: Cardiology

## 2024-04-20 ENCOUNTER — Inpatient Hospital Stay (INDEPENDENT_AMBULATORY_CARE_PROVIDER_SITE_OTHER)
Admission: RE | Admit: 2024-04-20 | Discharge: 2024-04-20 | Disposition: A | Payer: Self-pay | Source: Ambulatory Visit | Attending: Cardiology | Admitting: Cardiology

## 2024-04-20 DIAGNOSIS — R931 Abnormal findings on diagnostic imaging of heart and coronary circulation: Secondary | ICD-10-CM

## 2024-04-20 NOTE — Progress Notes (Signed)
 FFR Order

## 2024-04-21 ENCOUNTER — Other Ambulatory Visit: Payer: Self-pay

## 2024-04-21 DIAGNOSIS — I2510 Atherosclerotic heart disease of native coronary artery without angina pectoris: Secondary | ICD-10-CM | POA: Insufficient documentation

## 2024-04-21 MED ORDER — ASPIRIN 81 MG PO TBEC: 81.0000 mg | DELAYED_RELEASE_TABLET | Freq: Every day | ORAL | 3 refills | Status: AC

## 2024-04-25 ENCOUNTER — Ambulatory Visit

## 2024-04-25 VITALS — BP 130/72 | HR 75 | Ht 60.0 in | Wt 141.6 lb

## 2024-04-25 DIAGNOSIS — I050 Rheumatic mitral stenosis: Secondary | ICD-10-CM

## 2024-04-25 DIAGNOSIS — R931 Abnormal findings on diagnostic imaging of heart and coronary circulation: Secondary | ICD-10-CM

## 2024-04-25 DIAGNOSIS — E7800 Pure hypercholesterolemia, unspecified: Secondary | ICD-10-CM | POA: Diagnosis not present

## 2024-04-25 DIAGNOSIS — I25118 Atherosclerotic heart disease of native coronary artery with other forms of angina pectoris: Secondary | ICD-10-CM | POA: Diagnosis not present

## 2024-04-25 MED ORDER — METOPROLOL TARTRATE 25 MG PO TABS: 12.5000 mg | ORAL_TABLET | Freq: Two times a day (BID) | ORAL | 3 refills | Status: DC

## 2024-04-25 MED ORDER — ATORVASTATIN CALCIUM 80 MG PO TABS: 80.0000 mg | ORAL_TABLET | Freq: Every day | ORAL | 3 refills | Status: AC

## 2024-04-25 NOTE — Assessment & Plan Note (Signed)
-   Reviewed the results from the cardiac CT. Recommended cardiac cath. Discussed the procedure at length. Steps involved in the procedure, reviewed at length.  Shared Decision Making/Informed Consent{ The risks [stroke (1 in 1000), death (1 in 1000), kidney failure [usually temporary] (1 in 500), bleeding (1 in 200), allergic reaction [possibly serious] (1 in 200)], benefits (diagnostic support and management of coronary artery disease) and alternatives of a cardiac catheterization were discussed in detail with her and her husband and she is willing to proceed.  Tentatively to be scheduled at [facility] at the earliest. Understands potential need to stay overnight at the hospital and/or CT surgical evaluation. Options for revascularization with PCI versus CABG reviewed.   Advised her to continue aspirin  81 mg once daily. Continue atorvastatin, titrate up dose to 80 mg once daily. Will start low-dose metoprolol  tartrate 12.5 mg twice daily.  Advised however moderate to heavy exertion. If any significant symptoms of chest pain or shortness of breath at rest head to the ER right away or call 911.

## 2024-04-25 NOTE — Patient Instructions (Signed)
 Medication Instructions:  Your physician has recommended you make the following change in your medication:   START: Atorvastatin 80 mg daily START: Metoprolol  tartrate 12.5 mg two times  *If you need a refill on your cardiac medications before your next appointment, please call your pharmacy*  Lab Work: Your physician recommends that you return for lab work in:   Labs today: BMP, CBC  If you have labs (blood work) drawn today and your tests are completely normal, you will receive your results only by: MyChart Message (if you have MyChart) OR A paper copy in the mail If you have any lab test that is abnormal or we need to change your treatment, we will call you to review the results.  Testing/Procedures:  [place] [facility] [place]. [facility] [place] [address] [place] [address] Dept: [phone] Loc: [phone]  Elizabeth Francis  04/25/2024  You are scheduled for a Cardiac Catheterization on Tuesday, December 23 with Dr. Newman Lawrence.  1. Please arrive at the [facility] (Main Entrance A) at [facility]: [address] [place], [place] [address] at 5:30 AM (This time is 2 hour(s) before your procedure to ensure your preparation).   Free valet parking service is available. You will check in at ADMITTING. The support person will be asked to wait in the waiting room.  It is OK to have someone drop you off and come back when you are ready to be discharged.    Special note: Every effort is made to have your procedure done on time. Please understand that emergencies sometimes delay scheduled procedures.  2. Diet: Nothing to eat after midnight.   3. Hydration: You need to be well hydrated before your procedure. On December 23, you may drink approved liquids (see below) until 2 hours before the procedure, with 16 oz of water as your last intake.   List of approved liquids water, clear juice, clear tea,  black coffee, fruit juices, non-citric and without pulp, carbonated beverages, Gatorade, Kool -Aid, plain Jello-O and plain ice popsicles.  4. Labs: You will need to have blood drawn on Friday, December 12 at [facility]: [address] [place] . You do not need to be fasting.  5. Medication instructions in preparation for your procedure:   Contrast Allergy: No  On the morning of your procedure, take your Aspirin  81 mg and any morning medicines NOT listed above.  You may use sips of water.  6. Plan to go home the same day, you will only stay overnight if medically necessary. 7. Bring a current list of your medications and current insurance cards. 8. You MUST have a responsible person to drive you home. 9. Someone MUST be with you the first 24 hours after you arrive home or your discharge will be delayed. 10. Please wear clothes that are easy to get on and off and wear slip-on shoes.  Thank you for allowing us  to care for you!   -- [facility]   Follow-Up: At [facility], you and your health needs are our priority.  As part of our continuing mission to provide you with exceptional heart care, our providers are all part of one team.  This team includes your primary Cardiologist (physician) and Advanced Practice Providers or APPs (Physician Assistants and Nurse Practitioners) who all work together to provide you with the care you need, when you need it.  Your next  appointment:   6 week(s)  Provider:   Alean Kobus, MD    We recommend signing up for the patient portal called MyChart.  Sign up information is provided on this After Visit Summary.  MyChart is used to connect with patients for Virtual Visits (Telemedicine).  Patients are able to view lab/test results, encounter notes, upcoming appointments, etc.  Non-urgent messages can be sent to your provider as well.   To learn more about what you can do with MyChart, go to  [email or web address].   Other Instructions None
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
## 2024-04-25 NOTE — Assessment & Plan Note (Signed)
 Intensify lipid-lowering therapy. Titrate up atorvastatin to 80 mg once daily.

## 2024-04-25 NOTE — Assessment & Plan Note (Signed)
 No mitral stenosis as confirmed on echocardiogram results. Will remove this from her diagnosis.

## 2024-04-26 LAB — BASIC METABOLIC PANEL WITH GFR
BUN/Creatinine Ratio: 13 (ref 12–28)
BUN: 11 mg/dL (ref 8–27)
CO2: 23 mmol/L (ref 20–29)
Calcium: 9.8 mg/dL (ref 8.7–10.3)
Chloride: 100 mmol/L (ref 96–106)
Creatinine, Ser: 0.86 mg/dL (ref 0.57–1.00)
Glucose: 96 mg/dL (ref 70–99)
Potassium: 4.3 mmol/L (ref 3.5–5.2)
Sodium: 139 mmol/L (ref 134–144)
eGFR: 74 mL/min/1.73 (ref >59)

## 2024-04-26 LAB — CBC
Hematocrit: 40.9 % (ref 34.0–46.6)
Hemoglobin: 13 g/dL (ref 11.1–15.9)
MCH: 25.9 pg — ABNORMAL LOW (ref 26.6–33.0)
MCHC: 31.8 g/dL (ref 31.5–35.7)
MCV: 82 fL (ref 79–97)
Platelets: 213 x10E3/uL (ref 150–450)
RBC: 5.02 x10E6/uL (ref 3.77–5.28)
RDW: 13.4 % (ref 11.7–15.4)
WBC: 6.3 x10E3/uL (ref 3.4–10.8)

## 2024-05-04 ENCOUNTER — Encounter (HOSPITAL_BASED_OUTPATIENT_CLINIC_OR_DEPARTMENT_OTHER): Payer: Self-pay

## 2024-05-04 ENCOUNTER — Ambulatory Visit (HOSPITAL_BASED_OUTPATIENT_CLINIC_OR_DEPARTMENT_OTHER)
Admission: EM | Admit: 2024-05-04 | Discharge: 2024-05-04 | Disposition: A | Discharge status: Home / Self Care | Attending: Family Medicine | Admitting: Family Medicine

## 2024-05-04 ENCOUNTER — Ambulatory Visit (INDEPENDENT_AMBULATORY_CARE_PROVIDER_SITE_OTHER): Admit: 2024-05-04 | Discharge: 2024-05-04 | Disposition: A | Discharge status: Home / Self Care | Admitting: Radiology

## 2024-05-04 DIAGNOSIS — M25511 Pain in right shoulder: Secondary | ICD-10-CM

## 2024-05-04 MED ORDER — HYDROCODONE-ACETAMINOPHEN 5-325 MG PO TABS: 1.0000 | ORAL_TABLET | Freq: Four times a day (QID) | ORAL | 0 refills | Status: DC | PRN

## 2024-05-04 NOTE — ED Provider Notes (Signed)
 " [facility]    CSN: [id number] Arrival date & time: 05/04/24  1209      History   Chief Complaint Chief Complaint  Patient presents with   Shoulder Pain    HPI BOBETTE LEYH is a 68 y.o. female.   Patient is a 68 year old female presents today with right shoulder pain.  Pt states she started to have right shoulder pain on Friday. Denies known injury. States she is unable to move the shoulder at all. She does not have pain when she is keeping it still. Pt has not taken anything for the pian. No numbness, tingling or weakness in the arm.     Shoulder Pain   Past Medical History:  Diagnosis Date   Aortic atherosclerosis 03/11/2024   Backache 11/21/2023   BMI 26.0-26.9,adult    Cardiac CT  04/17/2024 calcium  score 524, CAD RADS 4 greater than 50% left main stenosis, abnormal CTFFR 04/21/2024   Chronic low back pain 12/12/2023   Chronic thoracic back pain 12/05/2023   Dislocation of scapulothoracic joint 12/12/2023   Dyspnea on exertion 03/11/2024   Hypercholesterolemia    Mitral valve stenosis    Pure hypercholesterolemia    Scapulalgia 12/05/2023   Scapular dysfunction 12/12/2023    Patient Active Problem List   Diagnosis Date Noted   Cardiac CT  04/17/2024 calcium  score 524, CAD RADS 4 greater than 50% left main stenosis, abnormal CTFFR 04/21/2024   Aortic atherosclerosis 03/11/2024   Dyspnea on exertion 03/11/2024   BMI 26.0-26.9,adult    Hypercholesterolemia    Pure hypercholesterolemia    Chronic low back pain 12/12/2023   Dislocation of scapulothoracic joint 12/12/2023   Scapular dysfunction 12/12/2023   Chronic thoracic back pain 12/05/2023   Scapulalgia 12/05/2023   Backache 11/21/2023    Past Surgical History:  Procedure Laterality Date   CYST REMOVAL NECK     TUBAL LIGATION      OB History   No obstetric history on file.      Home Medications    Prior to Admission medications  Medication Sig Start Date End Date Taking?  Authorizing Provider  HYDROcodone -acetaminophen  (NORCO/VICODIN) 5-325 MG tablet Take 1-2 tablets by mouth every 6 (six) hours as needed. 05/04/24  Yes Bobby Ragan A, FNP  aspirin  EC 81 MG tablet Take 1 tablet (81 mg total) by mouth daily. Swallow whole. 04/21/24   Madireddy, Alean SAUNDERS, MD  atorvastatin  (LIPITOR) 80 MG tablet Take 1 tablet (80 mg total) by mouth daily. 04/25/24   Madireddy, Alean SAUNDERS, MD  metoprolol  tartrate (LOPRESSOR ) 25 MG tablet Take 0.5 tablets (12.5 mg total) by mouth 2 (two) times daily. 04/25/24   Madireddy, Alean SAUNDERS, MD  traMADol  (ULTRAM ) 50 MG tablet Take 1 tablet (50 mg total) by mouth every 6 (six) hours as needed. 03/26/15   Jarold Olam HERO, PA-C    Family History Family History  Problem Relation Age of Onset   Hypertension Mother    Atrial fibrillation Mother    Heart attack Father    Hypertension Sister     Social History Social History[1]   Allergies   Latex, Meperidine, and Morphine   Review of Systems Review of Systems See HPI  Physical Exam Triage Vital Signs ED Triage Vitals  Encounter Vitals Group     BP 05/04/24 1301 117/73     Girls Systolic BP Percentile --      Girls Diastolic BP Percentile --      Boys Systolic BP Percentile --  Boys Diastolic BP Percentile --      Pulse Rate 05/04/24 1301 (!) 50     Resp 05/04/24 1301 20     Temp 05/04/24 1301 98.2 F (36.8 C)     Temp Source 05/04/24 1301 Oral     SpO2 05/04/24 1301 97 %     Weight --      Height --      Head Circumference --      Peak Flow --      Pain Score 05/04/24 1259 3     Pain Loc --      Pain Education --      Exclude from Growth Chart --    No data found.  Updated Vital Signs BP 117/73 (BP Location: Left Arm)   Pulse (!) 50   Temp 98.2 F (36.8 C) (Oral)   Resp 20   SpO2 97%   Visual Acuity Right Eye Distance:   Left Eye Distance:   Bilateral Distance:    Right Eye Near:   Left Eye Near:    Bilateral Near:     Physical Exam Vitals and  nursing note reviewed.  Constitutional:      General: She is not in acute distress.    Appearance: Normal appearance. She is not ill-appearing, toxic-appearing or diaphoretic.  Pulmonary:     Effort: Pulmonary effort is normal.  Musculoskeletal:        General: Tenderness present.     Left shoulder: Tenderness present. No swelling. Decreased range of motion.  Neurological:     Mental Status: She is alert.  Psychiatric:        Mood and Affect: Mood normal.      UC Treatments / Results  Labs (all labs ordered are listed, but only abnormal results are displayed) Labs Reviewed - No data to display  EKG   Radiology DG Shoulder Right Result Date: 05/04/2024 EXAM: 1 VIEW(S) XRAY OF THE SHOULDER 05/04/2024 01:32:49 PM COMPARISON: None available. CLINICAL HISTORY: shoulder pain FINDINGS: BONES AND JOINTS: Glenohumeral joint is normally aligned. No acute fracture. No malalignment. Moderate degenerative changes of the AC joint. SOFT TISSUES: Calcific tendinosis of the supraspinatus tendon. Visualized lung is unremarkable. IMPRESSION: 1. Calcific supraspinatus tendinosis. 2. Moderate acromioclavicular joint degenerative change. Electronically signed by: Michaeline Blanch MD 05/04/2024 01:41 PM EST RP Workstation: [id number]    Procedures Procedures (including critical care time)  Medications Ordered in UC Medications - No data to display  Initial Impression / Assessment and Plan / UC Course  I have reviewed the triage vital signs and the nursing notes.  Pertinent labs & imaging results that were available during my care of the patient were reviewed by me and considered in my medical decision making (see chart for details).     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  Most likely the cause of her pain.  Recommend icing and resting the shoulder.  Since she cannot take any ibuprofen due to upcoming procedure I  prescribed some hydrocodone  with Tylenol  for pain.  Do not mix this with tramadol .  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  Recommend icing and resting the shoulder.  Since you cannot take  any ibuprofen due to upcoming procedure I have prescribed some hydrocodone  with Tylenol  for pain.  Do not take your tramadol  when taking this.  Follow-up with your doctor for any continued issues   ED Prescriptions     Medication Sig Dispense Auth. Provider   HYDROcodone -acetaminophen  (NORCO/VICODIN) 5-325 MG tablet Take 1-2 tablets by mouth every 6 (six) hours as needed. 15 tablet Leiland Mihelich A, FNP      I have reviewed the PDMP during this encounter.     [1]  Social History Tobacco Use   Smoking status: Every Day    Current packs/day: 1.00    Types: Cigarettes  Substance Use Topics   Alcohol use: No   Drug use: No     Adah Wilbert LABOR, FNP 05/12/24 [id number]  "

## 2024-05-04 NOTE — ED Triage Notes (Signed)
 Pt states she started to have right shoulder pain on Friday. Denies known injury. States she is unable to move the shoulder at all. She does not have pain when she is keeping it still. Pt has not taken anything for the pian.

## 2024-05-04 NOTE — Discharge Instructions (Signed)
 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  Recommend icing and resting the shoulder.  Since you cannot take any ibuprofen due to upcoming procedure I have prescribed some hydrocodone  with Tylenol  for pain.  Do not take your tramadol  when taking this.  Follow-up with your doctor for any continued issues

## 2024-05-06 ENCOUNTER — Ambulatory Visit (HOSPITAL_COMMUNITY)
Admission: RE | Admit: 2024-05-06 | Discharge: 2024-05-06 | Disposition: A | Discharge status: Home / Self Care | Attending: Cardiology | Admitting: Cardiology

## 2024-05-06 ENCOUNTER — Other Ambulatory Visit: Payer: Self-pay

## 2024-05-06 ENCOUNTER — Telehealth: Payer: Self-pay | Admitting: Cardiology

## 2024-05-06 ENCOUNTER — Encounter (HOSPITAL_COMMUNITY): Admission: RE | Disposition: A | Payer: Self-pay | Attending: Cardiology

## 2024-05-06 DIAGNOSIS — F17210 Nicotine dependence, cigarettes, uncomplicated: Secondary | ICD-10-CM | POA: Diagnosis not present

## 2024-05-06 DIAGNOSIS — Z79899 Other long term (current) drug therapy: Secondary | ICD-10-CM | POA: Insufficient documentation

## 2024-05-06 DIAGNOSIS — Z7982 Long term (current) use of aspirin: Secondary | ICD-10-CM | POA: Diagnosis not present

## 2024-05-06 DIAGNOSIS — I25118 Atherosclerotic heart disease of native coronary artery with other forms of angina pectoris: Secondary | ICD-10-CM | POA: Insufficient documentation

## 2024-05-06 DIAGNOSIS — R931 Abnormal findings on diagnostic imaging of heart and coronary circulation: Secondary | ICD-10-CM | POA: Insufficient documentation

## 2024-05-06 DIAGNOSIS — I2584 Coronary atherosclerosis due to calcified coronary lesion: Secondary | ICD-10-CM | POA: Insufficient documentation

## 2024-05-06 HISTORY — PX: LEFT HEART CATH AND CORONARY ANGIOGRAPHY: CATH118249

## 2024-05-06 MED ORDER — ASPIRIN 81 MG PO CHEW: 81.0000 mg | CHEWABLE_TABLET | ORAL | Status: DC

## 2024-05-06 MED ORDER — HEPARIN (PORCINE) IN NACL 1000-0.9 UT/500ML-% IV SOLN
INTRAVENOUS | Status: DC | PRN
  Administered 2024-05-06: 1000 mL

## 2024-05-06 MED ORDER — SODIUM CHLORIDE 0.9% FLUSH: 3.0000 mL | Freq: Two times a day (BID) | INTRAVENOUS | Status: DC

## 2024-05-06 MED ORDER — SODIUM CHLORIDE 0.9 % IV SOLN: 250.0000 mL | INTRAVENOUS | Status: DC | PRN

## 2024-05-06 MED ORDER — FREE WATER: 500.0000 mL | Freq: Once | Status: DC

## 2024-05-06 MED ORDER — ONDANSETRON HCL 4 MG/2ML IJ SOLN: 4.0000 mg | Freq: Four times a day (QID) | INTRAMUSCULAR | Status: DC | PRN

## 2024-05-06 MED ORDER — HEPARIN SODIUM (PORCINE) 1000 UNIT/ML IJ SOLN
INTRAMUSCULAR | Status: AC
  Filled 2024-05-06: qty 10

## 2024-05-06 MED ORDER — MIDAZOLAM HCL (PF) 2 MG/2ML IJ SOLN
INTRAMUSCULAR | Status: DC | PRN
  Administered 2024-05-06: 1 mg via INTRAVENOUS

## 2024-05-06 MED ORDER — FENTANYL CITRATE (PF) 100 MCG/2ML IJ SOLN
INTRAMUSCULAR | Status: DC | PRN
  Administered 2024-05-06: 25 ug via INTRAVENOUS

## 2024-05-06 MED ORDER — SODIUM CHLORIDE 0.9% FLUSH: 3.0000 mL | INTRAVENOUS | Status: DC | PRN

## 2024-05-06 MED ORDER — MIDAZOLAM HCL 2 MG/2ML IJ SOLN
INTRAMUSCULAR | Status: AC
  Filled 2024-05-06: qty 2

## 2024-05-06 MED ORDER — ACETAMINOPHEN 325 MG PO TABS: 650.0000 mg | ORAL_TABLET | ORAL | Status: DC | PRN

## 2024-05-06 MED ORDER — FENTANYL CITRATE (PF) 100 MCG/2ML IJ SOLN
INTRAMUSCULAR | Status: AC
  Filled 2024-05-06: qty 2

## 2024-05-06 MED ORDER — LIDOCAINE HCL (PF) 1 % IJ SOLN
INTRAMUSCULAR | Status: AC
  Filled 2024-05-06: qty 30

## 2024-05-06 MED ORDER — IOHEXOL 350 MG/ML SOLN
INTRAVENOUS | Status: DC | PRN
  Administered 2024-05-06: 35 mL

## 2024-05-06 MED ORDER — LIDOCAINE HCL (PF) 1 % IJ SOLN
INTRAMUSCULAR | Status: DC | PRN
  Administered 2024-05-06: 5 mL via INTRADERMAL

## 2024-05-06 MED ORDER — HYDRALAZINE HCL 20 MG/ML IJ SOLN: 10.0000 mg | INTRAMUSCULAR | Status: DC | PRN

## 2024-05-06 MED ORDER — VERAPAMIL HCL 2.5 MG/ML IV SOLN
INTRAVENOUS | Status: DC | PRN
  Administered 2024-05-06: 10 mL via INTRA_ARTERIAL

## 2024-05-06 MED ORDER — HEPARIN SODIUM (PORCINE) 1000 UNIT/ML IJ SOLN
INTRAMUSCULAR | Status: DC | PRN
  Administered 2024-05-06: 3500 [IU] via INTRAVENOUS

## 2024-05-06 MED ORDER — VERAPAMIL HCL 2.5 MG/ML IV SOLN
INTRAVENOUS | Status: AC
  Filled 2024-05-06: qty 2

## 2024-05-06 MED ORDER — LABETALOL HCL 5 MG/ML IV SOLN: 10.0000 mg | INTRAVENOUS | Status: DC | PRN

## 2024-05-06 NOTE — Telephone Encounter (Signed)
 Spoke to Dr. Krasowski regarding this patient's questions regarding the next steps in her care. Dr. Krasowski recommended that the patient schedule an appointment with Dr. Liborio to discuss with him what the next options are after her cardiac cath. I relayed Dr. Karry recommendation to the patient and an appointment was scheduled on 12/29 at 1:40 pm for her to see Dr. Liborio. Patient verbalized understanding and had no further questions at this time.

## 2024-05-06 NOTE — Telephone Encounter (Signed)
 Called the patient and she stated that she had a cardiac cath today but Dr. Ritchie told her that her veins were too messed up to be able to place the stent. Patient is calling today asking since they were not able to place the stent, what are the next options for her care. Please advise.

## 2024-05-06 NOTE — Interval H&P Note (Signed)
 History and Physical Interval Note:  05/06/2024 7:40 AM  Elizabeth Francis  has presented today for surgery, with the diagnosis of abnormal ct.  The various methods of treatment have been discussed with the patient and family. After consideration of risks, benefits and other options for treatment, the patient has consented to  Procedures: LEFT HEART CATH AND CORONARY ANGIOGRAPHY (N/A) as a surgical intervention.  The patient's history has been reviewed, patient examined, no change in status, stable for surgery.  I have reviewed the patient's chart and labs.  Questions were answered to the patient's satisfaction.     Jenney Brester J Brentin Shin
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
## 2024-05-06 NOTE — Telephone Encounter (Signed)
 Pt could not have her stent procedure this morning per Dr. Elmira. Pt requesting c/b with further explanation for next steps. Please advise.

## 2024-05-06 NOTE — Discharge Instructions (Addendum)
 Elizabeth Francis

## 2024-05-06 NOTE — Progress Notes (Signed)
 Discharge instructions reviewed with patient and husband at bedside. Denies questions concerns. PT tolerated PO intake. Seen by MD. Incision site remains clean dry and intact. No s/s of complications. PT escorted from the unit via wheel chair to personal vehicle.

## 2024-05-07 ENCOUNTER — Encounter (HOSPITAL_COMMUNITY): Payer: Self-pay | Admitting: Cardiology

## 2024-05-12 ENCOUNTER — Ambulatory Visit

## 2024-05-12 VITALS — BP 120/56 | HR 54 | Ht 61.0 in | Wt 142.0 lb

## 2024-05-12 DIAGNOSIS — E7800 Pure hypercholesterolemia, unspecified: Secondary | ICD-10-CM | POA: Diagnosis not present

## 2024-05-12 DIAGNOSIS — I25118 Atherosclerotic heart disease of native coronary artery with other forms of angina pectoris: Secondary | ICD-10-CM
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
## 2024-05-12 NOTE — Assessment & Plan Note (Signed)
-   Continue atorvastatin 80 mg once daily.

## 2024-05-12 NOTE — Assessment & Plan Note (Signed)
"   cardiac cath 05/06/2024 showing severe left main disease consistent with cardiac CT imaging.  LVEDP 18 mmHg Referral to CT surgeon placed and currently pending visit with Dr. Con on 05/29/2024.  Currently they had questions about Dr. Silverio expertise based on such results on a website and now requesting a different CT surgeon for appointment. They will review options and let us  know whom they would like to see.   Advise continuing to avoid moderate heavy exertion. If any symptoms are to call 911 or head to the ER right away.  Continue aspirin , atorvastatin  and metoprolol .  "

## 2024-05-12 NOTE — Patient Instructions (Signed)
 Medication Instructions:  Your physician recommends that you continue on your current medications as directed. Please refer to the Current Medication list given to you today.  *If you need a refill on your cardiac medications before your next appointment, please call your pharmacy*   Lab Work: None ordered If you have labs (blood work) drawn today and your tests are completely normal, you will receive your results only by: MyChart Message (if you have MyChart) OR A paper copy in the mail If you have any lab test that is abnormal or we need to change your treatment, we will call you to review the results.   Testing/Procedures: None ordered   Follow-Up: At [facility], you and your health needs are our priority.  As part of our continuing mission to provide you with exceptional heart care, we have created designated Provider Care Teams.  These Care Teams include your primary Cardiologist (physician) and Advanced Practice Providers (APPs -  Physician Assistants and Nurse Practitioners) who all work together to provide you with the care you need, when you need it.  We recommend signing up for the patient portal called "MyChart".  Sign up information is provided on this After Visit Summary.  MyChart is used to connect with patients for Virtual Visits (Telemedicine).  Patients are able to view lab/test results, encounter notes, upcoming appointments, etc.  Non-urgent messages can be sent to your provider as well.   To learn more about what you can do with MyChart, go to [email or web address].    Your next appointment:   2 month(s)  The format for your next appointment:   In Person  Provider:   Huntley Dec, MD    Other Instructions none  Important Information About Sugar

## 2024-05-19 ENCOUNTER — Telehealth: Payer: Self-pay

## 2024-05-19 NOTE — Telephone Encounter (Signed)
 Patient is requesting to have her records sent to Dr. Lucas at fax # [phone]. She says she would like a second opinion prior to arranging open heart surgery.

## 2024-05-20 ENCOUNTER — Ambulatory Visit

## 2024-05-20 NOTE — Telephone Encounter (Signed)
 When speaking with patient, she states that she lost trust in Dr. Liborio as him and Dr. Patwardan was telling her opposite information regarding which artery required.  Pt aware that the Dr. Lucas and Dr. Daniel are in the same practice. Advised to call their office if requesting Dr. Lucas. Pt verbalized understanding and had no additional questions.

## 2024-05-21 ENCOUNTER — Ambulatory Visit: Attending: Surgery | Admitting: Surgery

## 2024-05-21 ENCOUNTER — Other Ambulatory Visit: Payer: Self-pay | Admitting: *Deleted

## 2024-05-21 ENCOUNTER — Encounter: Admitting: Surgery

## 2024-05-21 ENCOUNTER — Encounter: Payer: Self-pay | Admitting: Surgery

## 2024-05-21 ENCOUNTER — Encounter: Payer: Self-pay | Admitting: *Deleted

## 2024-05-21 VITALS — BP 119/66 | HR 57 | Resp 18 | Ht 61.0 in | Wt 140.0 lb

## 2024-05-21 DIAGNOSIS — I2510 Atherosclerotic heart disease of native coronary artery without angina pectoris: Secondary | ICD-10-CM

## 2024-05-21 NOTE — Progress Notes (Signed)
 "  [address] [place] [address]             [phone]     Cardiothoracic Surgery Consultation   PCP is Ina Marcellus RAMAN, MD Referring Provider is Elmira Newman PARAS, MD  Chief Complaint  Patient presents with   Coronary Artery Disease    Surgical consult/ Cardiac Cath 05/06/24/ ECHO 04/03/24    HPI:  The patient is a 69 year old woman with a history of hyperlipidemia, GERD, previous smoking until July 2024 with continued vaping, and coronary artery disease who was referred for consideration of CABG.  She was evaluated by cardiology and reported dyspnea on exertion which was felt to be an anginal equivalent and underwent a cardiac CT showing a coronary calcium  score of 523 which was 95th percentile with evidence of greater than 50% left main stenosis.  FFR was consistent with hemodynamically significant left main stenosis.  Cardiac catheterization on 05/06/2024 showed an 80% left main stenosis extending into the bifurcation.  The left circumflex was a large dominant vessel with no disease.  The right was a small nondominant vessel with no disease.  The LAD had mild calcification in the midportion without significant stenosis.  She is here today with her husband.  She stays relatively active.  Her symptoms mainly occur with significant exertion like pushing her lawnmower or doing heavy work around the house.  She has no symptoms with routine activity or walking on level ground.  Her main symptom has been exertional shortness of breath and tightness in her chest Past Medical History:  Diagnosis Date   Aortic atherosclerosis 03/11/2024   Backache 11/21/2023   BMI 26.0-26.9,adult    Cardiac CT  04/17/2024 calcium  score 524, CAD RADS 4 greater than 50% left main stenosis, abnormal CTFFR 04/21/2024   Chronic low back pain 12/12/2023   Chronic thoracic back pain 12/05/2023   Dislocation of scapulothoracic joint 12/12/2023   Dyspnea on exertion 03/11/2024    Hypercholesterolemia    Mitral valve stenosis    Pure hypercholesterolemia    Scapulalgia 12/05/2023   Scapular dysfunction 12/12/2023    Past Surgical History:  Procedure Laterality Date   CYST REMOVAL NECK     LEFT HEART CATH AND CORONARY ANGIOGRAPHY N/A 05/06/2024   Procedure: LEFT HEART CATH AND CORONARY ANGIOGRAPHY;  Surgeon: Elmira Newman PARAS, MD;  Location: [facility];  Service: Cardiovascular;  Laterality: N/A;   TUBAL LIGATION      Family History  Problem Relation Age of Onset   Hypertension Mother    Atrial fibrillation Mother    Heart attack Father    Hypertension Sister     Social History Social History[1]  Current Outpatient Medications  Medication Sig Dispense Refill   aspirin  EC 81 MG tablet Take 1 tablet (81 mg total) by mouth daily. Swallow whole. 90 tablet 3   atorvastatin  (LIPITOR) 80 MG tablet Take 1 tablet (80 mg total) by mouth daily. 90 tablet 3   metoprolol  tartrate (LOPRESSOR ) 25 MG tablet Take 0.5 tablets (12.5 mg total) by mouth 2 (two) times daily. 180 tablet 3   traMADol  (ULTRAM ) 50 MG tablet Take 1 tablet (50 mg total) by mouth every 6 (six) hours as needed. 15 tablet 0   No current facility-administered medications for this visit.    Allergies[2]  Review of Systems  Constitutional: Negative.   HENT: Negative.    Eyes: Negative.   Respiratory:  Positive for chest tightness and shortness  of breath.   Cardiovascular:  Negative for chest pain and leg swelling.  Gastrointestinal: Negative.   Endocrine: Negative.   Genitourinary: Negative.   Musculoskeletal: Negative.   Neurological:  Negative for dizziness and syncope.  Hematological: Negative.   Psychiatric/Behavioral: Negative.      BP 119/66 (BP Location: Right Arm)   Pulse (!) 57   Resp 18   Ht 5' 1 (1.549 m)   Wt 140 lb (63.5 kg)   SpO2 99% Comment: RA  BMI 26.45 kg/m  Physical Exam Constitutional:      Appearance: Normal appearance.  HENT:     Head:  Normocephalic and atraumatic.  Eyes:     Extraocular Movements: Extraocular movements intact.     Conjunctiva/sclera: Conjunctivae normal.     Pupils: Pupils are equal, round, and reactive to light.  Cardiovascular:     Rate and Rhythm: Normal rate and regular rhythm.     Pulses: Normal pulses.     Heart sounds: Normal heart sounds. No murmur heard. Pulmonary:     Effort: Pulmonary effort is normal.     Breath sounds: Normal breath sounds.  Abdominal:     General: There is no distension.     Tenderness: There is no abdominal tenderness.  Musculoskeletal:        General: No swelling.     Cervical back: Normal range of motion and neck supple.  Skin:    General: Skin is warm and dry.  Neurological:     General: No focal deficit present.     Mental Status: She is alert and oriented to person, place, and time.  Psychiatric:        Mood and Affect: Mood normal.        Behavior: Behavior normal.      Diagnostic Tests:  Procedures  LEFT HEART CATH AND CORONARY ANGIOGRAPHY   Conclusion  Coronary angiography 05/06/2024: LM: Proximal to distal 80% stenosis LAD: Proximal post stenotic dilatation, mild calcification in mid LAD without significant stenosis  Lcx: Large dominant vessel, no significant disease RCA: Small nondominant, no significant disease   LVEDP 18 mmHg      Conclusion: Severe left main disease   Recommendation: CVTS consult for CABG Avoid physical exertion Continue current medical management   Newman JINNY Lawrence, MD      ECHOCARDIOGRAM REPORT       Patient Name:   Elizabeth Francis Date of Exam: 04/03/2024  Medical Rec #:  [id number]         Height:       60.0 in  Accession #:    [id number]        Weight:       142.8 lb  Date of Birth:  [birth year]         BSA:          1.618 m  Patient Age:    68 years          BP:           128/74 mmHg  Patient Gender: F                 HR:           67 bpm.  Exam Location:  [facility]   Procedure: 2D Echo,  Cardiac Doppler, Color Doppler and Strain Analysis  (Both            Spectral and Color Flow Doppler were utilized during  procedure).   Indications:   Precordial pain [  R07.2 (ICD-10-CM)], Dyspnea on exertion  [R06.09                 (ICD-10-CM)]    History:        Patient has no prior history of Echocardiogram  examinations.                 Signs/Symptoms:Dyspnea; Risk Factors:Former Smoker.    Sonographer:    Saddie Chimes  Referring Phys: [id number] SREEDHAR R MADIREDDY   IMPRESSIONS     1. Left ventricular ejection fraction, by estimation, is 55 to 60%. The  left ventricle has normal function. The left ventricle has no regional  wall motion abnormalities. Left ventricular diastolic parameters were  normal. The average left ventricular  global longitudinal strain is 19.4 %.   2. Right ventricular systolic function is normal. The right ventricular  size is normal. There is normal pulmonary artery systolic pressure.   3. The mitral valve is grossly normal. Trivial mitral valve  regurgitation. No evidence of mitral stenosis.   4. The aortic valve is grossly normal. Aortic valve regurgitation is not  visualized. No aortic stenosis is present.   5. The inferior vena cava is normal in size with greater than 50%  respiratory variability, suggesting right atrial pressure of 3 mmHg.   FINDINGS   Left Ventricle: Left ventricular ejection fraction, by estimation, is 55  to 60%. The left ventricle has normal function. The left ventricle has no  regional wall motion abnormalities. The average left ventricular global  longitudinal strain is 19.4 %. The   left ventricular internal cavity size was normal in size. There is no  left ventricular hypertrophy. Left ventricular diastolic parameters were  normal.   Right Ventricle: The right ventricular size is normal. Right vetricular  wall thickness was not well visualized. Right ventricular systolic  function is normal. There is normal  pulmonary artery systolic pressure.  The tricuspid regurgitant velocity is 2.01  m/s, and with an assumed right atrial pressure of 3 mmHg, the estimated  right ventricular systolic pressure is 19.2 mmHg.   Left Atrium: Left atrial size was normal in size.   Right Atrium: Right atrial size was normal in size.   Pericardium: There is no evidence of pericardial effusion.   Mitral Valve: The mitral valve is grossly normal. Trivial mitral valve  regurgitation. No evidence of mitral valve stenosis. MV peak gradient, 2.3  mmHg. The mean mitral valve gradient is 1.0 mmHg.   Tricuspid Valve: The tricuspid valve is grossly normal. Tricuspid valve  regurgitation is trivial. No evidence of tricuspid stenosis.   Aortic Valve: The aortic valve is grossly normal. Aortic valve  regurgitation is not visualized. No aortic stenosis is present. Aortic  valve mean gradient measures 3.5 mmHg. Aortic valve peak gradient measures  6.2 mmHg. Aortic valve area, by VTI measures  2.05 cm.   Pulmonic Valve: The pulmonic valve was not well visualized. Pulmonic valve  regurgitation is not visualized. No evidence of pulmonic stenosis.   Aorta: The aortic root and ascending aorta are structurally normal, with  no evidence of dilitation.   Venous: The inferior vena cava is normal in size with greater than 50%  respiratory variability, suggesting right atrial pressure of 3 mmHg.   IAS/Shunts: The interatrial septum was not well visualized.     LEFT VENTRICLE  PLAX 2D  LVIDd:         4.20 cm   Diastology  LVIDs:  2.80 cm   LV e' medial:    12.10 cm/s  LV PW:         1.00 cm   LV E/e' medial:  5.7  LV IVS:        1.00 cm   LV e' lateral:   13.10 cm/s  LVOT diam:     1.90 cm   LV E/e' lateral: 5.3  LV SV:         58  LV SV Index:   36        2D Longitudinal Strain  LVOT Area:     2.84 cm  2D Strain GLS Avg:     19.4 %     RIGHT VENTRICLE  RV Basal diam:  3.10 cm  RV Mid diam:    2.10 cm  TAPSE  (M-mode): 1.9 cm   LEFT ATRIUM           Index  LA diam:      2.80 cm 1.73 cm/m  LA Vol (A4C): 37.1 ml 22.93 ml/m   AORTIC VALVE                    PULMONIC VALVE  AV Area (Vmax):    2.10 cm     PV Vmax:       0.64 m/s  AV Area (Vmean):   2.00 cm     PV Vmean:      46.800 cm/s  AV Area (VTI):     2.05 cm     PV VTI:        0.170 m  AV Vmax:           124.00 cm/s  PV Peak grad:  1.7 mmHg  AV Vmean:          82.700 cm/s  PV Mean grad:  1.0 mmHg  AV VTI:            0.282 m  AV Peak Grad:      6.2 mmHg  AV Mean Grad:      3.5 mmHg  LVOT Vmax:         91.95 cm/s  LVOT Vmean:        58.450 cm/s  LVOT VTI:          0.204 m  LVOT/AV VTI ratio: 0.72    AORTA  Ao Asc diam: 3.05 cm   MITRAL VALVE               TRICUSPID VALVE  MV Area (PHT): 1.96 cm    TR Peak grad:   16.2 mmHg  MV Area VTI:   1.40 cm    TR Vmax:        201.00 cm/s  MV Peak grad:  2.3 mmHg  MV Mean grad:  1.0 mmHg    SHUNTS  MV Vmax:       0.76 m/s    Systemic VTI:  0.20 m  MV Vmean:      46.8 cm/s   Systemic Diam: 1.90 cm  MV Decel Time: 387 msec  MV E velocity: 69.20 cm/s  MV A velocity: 60.80 cm/s  MV E/A ratio:  1.14   Sreedhar reddy Madireddy  Electronically signed by Alean reddy Madireddy  Signature Date/Time: 04/04/2024/5:24:48 PM        Final      Impression:  This 69 year old woman has high-grade left main coronary stenosis with a significant drop in FFR in the LAD to 0.6 approximately to distally and  left circumflex territory to 0.61 proximally to distally.  Left ventricular ejection fraction is normal.  I agree that coronary artery bypass graft surgery is the best treatment to resolve her symptoms and prevent massive myocardial infarction and sudden death. I discussed the operative procedure with the patient and her husband including alternatives, benefits and risks; including but not limited to bleeding, blood transfusion, infection, stroke, myocardial infarction, graft failure, heart block  requiring a permanent pacemaker, organ dysfunction, and death.  Monta KATHEE Ditch understands and agrees to proceed.   Plan:  She will be scheduled for coronary bypass graft surgery on Monday, 06/02/2024.  I spent 60 minutes performing this consultation and > 50% of this time was spent face to face counseling and coordinating the care of this patient's high-grade left main coronary stenosis.   Dorise MARLA Fellers, MD [facility] [phone]      [1]  Social History Tobacco Use   Smoking status: Every Day    Current packs/day: 1.00    Types: Cigarettes  Substance Use Topics   Alcohol use: No   Drug use: No  [2]  Allergies Allergen Reactions   Latex Dermatitis   Meperidine     Other Reaction(s): Not available  meperidine   Morphine     Other Reaction(s): Not available  morphine   "

## 2024-05-28 NOTE — Progress Notes (Signed)
 Surgical Instructions   Your procedure is scheduled on Monday June 02, 2024. Report to [facility] Main Entrance A at 5:30 A.M., then check in with the Admitting office. Any questions or running late day of surgery: call [phone]  Questions prior to your surgery date: call [phone], Monday-Friday, 8am-4pm. If you experience any cold or flu symptoms such as cough, fever, chills, shortness of breath, etc. between now and your scheduled surgery, please notify us  at the above number.     Remember:  Do not eat or drink after midnight the night before your surgery  Take these medicines the morning of surgery with A SIP OF WATER   metoprolol  tartrate (LOPRESSOR )  atorvastatin  (LIPITOR)   May take these medicines IF NEEDED: traMADol  (ULTRAM )   CONTINUE TAKING YOUR ASPIRIN  UP TO AND INCLUDING THE DAY BEFORE SURGERY. DO NOT TAKE THE MORNING OF SURGERY.    One week prior to surgery, STOP taking any Aleve, Naproxen, Ibuprofen, Motrin, Advil, Goody's, BC's, all herbal medications, fish oil, and non-prescription vitamins.                     Do NOT Smoke (Tobacco/Vaping) for 24 hours prior to your procedure.  If you use a CPAP at night, you may bring your mask/headgear for your overnight stay.   You will be asked to remove any contacts, glasses, piercing's, hearing aid's, dentures/partials prior to surgery. Please bring cases for these items if needed.    Your surgeon will determine if you are to be admitted or discharged the same day.  Patients discharged the day of surgery will not be allowed to drive home, and someone needs to stay with them for 24 hours.  SURGICAL WAITING ROOM VISITATION Patients may have no more than 2 support people in the waiting area - these visitors may rotate.   Pre-op nurse will coordinate an appropriate time for 2 ADULT support persons, who may not rotate, to accompany patient in pre-op.  Children under the age of 17 must have an adult with them who is  not the patient and must remain in the main waiting area with an adult.  If the patient needs to stay at the hospital during part of their recovery, the visitor guidelines for inpatient rooms apply.  Please refer to the [facility] website for the visitor guidelines for any additional information.   If you received a COVID test during your pre-op visit  it is requested that you wear a mask when out in public, stay away from anyone that may not be feeling well and notify your surgeon if you develop symptoms. If you have been in contact with anyone that has tested positive in the last 10 days please notify you surgeon.      Pre-operative CHG Bathing Instructions   You can play a key role in reducing the risk of infection after surgery. Your skin needs to be as free of germs as possible. You can reduce the number of germs on your skin by washing with CHG (chlorhexidine gluconate) soap before surgery. CHG is an antiseptic soap that kills germs and continues to kill germs even after washing.   DO NOT use if you have an allergy to chlorhexidine/CHG or antibacterial soaps. If your skin becomes reddened or irritated, stop using the CHG and notify one of our RNs at [phone].              TAKE A SHOWER THE NIGHT BEFORE SURGERY   Please keep in  mind the following:  DO NOT shave, including legs and underarms, 48 hours prior to surgery.   Place clean sheets on your bed the night before surgery Use a clean washcloth (not used since being washed) for shower. DO NOT sleep with pet's night before surgery.  CHG Shower Instructions:  Wash your face and private area with normal soap. If you choose to wash your hair, wash first with your normal shampoo.  After you use shampoo/soap, rinse your hair and body thoroughly to remove shampoo/soap residue.  Turn the water  OFF and apply half the bottle of CHG soap to a CLEAN washcloth.  Apply CHG soap ONLY FROM YOUR NECK DOWN TO YOUR TOES (washing for 3-5  minutes)  DO NOT use CHG soap on face, private areas, open wounds, or sores.  Pay special attention to the area where your surgery is being performed.  If you are having back surgery, having someone wash your back for you may be helpful. Wait 2 minutes after CHG soap is applied, then you may rinse off the CHG soap.  Pat dry with a clean towel  Put on clean pajamas    Additional instructions for the day of surgery: If you choose, you may shower the morning of surgery with an antibacterial soap.  DO NOT APPLY any lotions, deodorants or perfumes.   Do not wear jewelry or makeup Do not wear nail polish, gel polish, artificial nails, or any other type of covering on natural nails (fingers and toes) Do not bring valuables to the hospital. [facility] is not responsible for valuables/personal belongings. Put on clean/comfortable clothes.  Please brush your teeth.  Ask your nurse before applying any prescription medications to the skin.

## 2024-05-29 ENCOUNTER — Ambulatory Visit (HOSPITAL_COMMUNITY)
Admission: RE | Admit: 2024-05-29 | Discharge: 2024-05-29 | Disposition: A | Discharge status: Home / Self Care | Source: Ambulatory Visit | Attending: Surgery | Admitting: Surgery

## 2024-05-29 ENCOUNTER — Encounter

## 2024-05-29 ENCOUNTER — Other Ambulatory Visit: Payer: Self-pay

## 2024-05-29 ENCOUNTER — Encounter (HOSPITAL_COMMUNITY): Payer: Self-pay

## 2024-05-29 ENCOUNTER — Ambulatory Visit (HOSPITAL_COMMUNITY)
Admission: RE | Admit: 2024-05-29 | Discharge: 2024-05-29 | Disposition: A | Source: Ambulatory Visit | Attending: Surgery | Admitting: Surgery

## 2024-05-29 ENCOUNTER — Encounter (HOSPITAL_COMMUNITY)
Admission: RE | Admit: 2024-05-29 | Discharge: 2024-05-29 | Disposition: A | Source: Ambulatory Visit | Attending: Surgery

## 2024-05-29 VITALS — BP 129/70 | HR 52 | Temp 98.0°F | Resp 17 | Ht 61.0 in | Wt 141.8 lb

## 2024-05-29 DIAGNOSIS — Z87891 Personal history of nicotine dependence: Secondary | ICD-10-CM | POA: Insufficient documentation

## 2024-05-29 DIAGNOSIS — Z01818 Encounter for other preprocedural examination: Secondary | ICD-10-CM | POA: Insufficient documentation

## 2024-05-29 DIAGNOSIS — I2510 Atherosclerotic heart disease of native coronary artery without angina pectoris: Secondary | ICD-10-CM | POA: Diagnosis not present

## 2024-05-29 DIAGNOSIS — R9431 Abnormal electrocardiogram [ECG] [EKG]: Secondary | ICD-10-CM | POA: Diagnosis not present

## 2024-05-29 DIAGNOSIS — I050 Rheumatic mitral stenosis: Secondary | ICD-10-CM | POA: Diagnosis not present

## 2024-05-29 DIAGNOSIS — Z89412 Acquired absence of left great toe: Secondary | ICD-10-CM | POA: Diagnosis not present

## 2024-05-29 DIAGNOSIS — E785 Hyperlipidemia, unspecified: Secondary | ICD-10-CM | POA: Diagnosis not present

## 2024-05-29 DIAGNOSIS — I700 Atherosclerosis of aorta: Secondary | ICD-10-CM | POA: Diagnosis not present

## 2024-05-29 HISTORY — DX: Other complications of anesthesia, initial encounter: T88.59XA

## 2024-05-29 HISTORY — DX: Pneumonia, unspecified organism: J18.9

## 2024-05-29 HISTORY — DX: Unspecified osteoarthritis, unspecified site: M19.90

## 2024-05-29 HISTORY — DX: Other specified postprocedural states: R11.2

## 2024-05-29 HISTORY — DX: Headache, unspecified: R51.9

## 2024-05-29 LAB — COMPREHENSIVE METABOLIC PANEL WITH GFR
ALT: 13 U/L (ref 0–44)
AST: 21 U/L (ref 15–41)
Albumin: 4.2 g/dL (ref 3.5–5.0)
Alkaline Phosphatase: 117 U/L (ref 38–126)
Anion gap: 8 (ref 5–15)
BUN: 11 mg/dL (ref 8–23)
CO2: 29 mmol/L (ref 22–32)
Calcium: 9.8 mg/dL (ref 8.9–10.3)
Chloride: 104 mmol/L (ref 98–111)
Creatinine, Ser: 0.84 mg/dL (ref 0.44–1.00)
GFR, Estimated: >60 mL/min
Glucose, Bld: 109 mg/dL — ABNORMAL HIGH (ref 70–99)
Potassium: 4.8 mmol/L (ref 3.5–5.1)
Sodium: 140 mmol/L (ref 135–145)
Total Bilirubin: 0.4 mg/dL (ref 0.0–1.2)
Total Protein: 7.5 g/dL (ref 6.5–8.1)

## 2024-05-29 LAB — PROTIME-INR
INR: 0.9 (ref 0.8–1.2)
Prothrombin Time: 13.1 s (ref 11.4–15.2)

## 2024-05-29 LAB — CBC
HCT: 40.4 % (ref 36.0–46.0)
Hemoglobin: 12.5 g/dL (ref 12.0–15.0)
MCH: 25.6 pg — ABNORMAL LOW (ref 26.0–34.0)
MCHC: 30.9 g/dL (ref 30.0–36.0)
MCV: 82.6 fL (ref 80.0–100.0)
Platelets: 214 K/uL (ref 150–400)
RBC: 4.89 MIL/uL (ref 3.87–5.11)
RDW: 14.4 % (ref 11.5–15.5)
WBC: 8.5 K/uL (ref 4.0–10.5)
nRBC: 0 % (ref 0.0–0.2)

## 2024-05-29 LAB — HEMOGLOBIN A1C
Hgb A1c MFr Bld: 6 % — ABNORMAL HIGH (ref 4.8–5.6)
Mean Plasma Glucose: 125.5 mg/dL

## 2024-05-29 LAB — URINALYSIS, ROUTINE W REFLEX MICROSCOPIC
Bilirubin Urine: NEGATIVE
Glucose, UA: NEGATIVE mg/dL
Hgb urine dipstick: NEGATIVE
Ketones, ur: NEGATIVE mg/dL
Leukocytes,Ua: NEGATIVE
Nitrite: NEGATIVE
Protein, ur: NEGATIVE mg/dL
Specific Gravity, Urine: 1.004 — ABNORMAL LOW (ref 1.005–1.030)
pH: 7 (ref 5.0–8.0)

## 2024-05-29 LAB — SURGICAL PCR SCREEN
MRSA, PCR: NEGATIVE
Staphylococcus aureus: NEGATIVE

## 2024-05-29 LAB — APTT: aPTT: 28 s (ref 24–36)

## 2024-05-29 NOTE — Progress Notes (Signed)
 PCP - Marcellus Baptist, MD Cardiologist - Dr. Alean Madireddy - last office visit 05/12/2024  PPM/ICD - Denies Device Orders - n/a Rep Notified - n/a  Chest x-ray - 05/29/2024 EKG - 05/29/2024 Stress Test - Denies ECHO - 04/03/2024 Cardiac Cath - 05/06/2024  Sleep Study - Denies CPAP - n/a  No DM  Last dose of GLP1 agonist- n/a GLP1 instructions: n/a  Blood Thinner Instructions: n/a Aspirin  Instructions: Pt instructed to continue taking ASA through the day before surgery and none the morning of surgery  NPO after midnight  COVID TEST- n/a   Anesthesia review: No   Patient denies shortness of breath, fever, cough and chest pain at PAT appointment. Pt denies any respiratory illness/infection in the last two months   All instructions explained to the patient, with a verbal understanding of the material. Patient agrees to go over the instructions while at home for a better understanding. Patient also instructed to self quarantine after being tested for COVID-19. The opportunity to ask questions was provided.

## 2024-05-30 MED ORDER — HEPARIN 30,000 UNITS/1000 ML (OHS) CELLSAVER SOLUTION
Status: DC
  Filled 2024-05-30: qty 1000

## 2024-05-30 MED ORDER — INSULIN REGULAR(HUMAN) IN NACL 100-0.9 UT/100ML-% IV SOLN
INTRAVENOUS | Status: AC
  Administered 2024-06-02: .8 [IU]/h via INTRAVENOUS
  Filled 2024-05-30: qty 100

## 2024-05-30 MED ORDER — CEFAZOLIN SODIUM-DEXTROSE 2-4 GM/100ML-% IV SOLN
2.0000 g | INTRAVENOUS | Status: AC
  Administered 2024-06-02: 2 g via INTRAVENOUS
  Filled 2024-05-30: qty 100

## 2024-05-30 MED ORDER — MILRINONE LACTATE IN DEXTROSE 20-5 MG/100ML-% IV SOLN
0.3000 ug/kg/min | INTRAVENOUS | Status: DC
  Filled 2024-05-30: qty 100

## 2024-05-30 MED ORDER — POTASSIUM CHLORIDE 2 MEQ/ML IV SOLN
80.0000 meq | INTRAVENOUS | Status: DC
  Filled 2024-05-30: qty 40

## 2024-05-30 MED ORDER — NITROGLYCERIN IN D5W 200-5 MCG/ML-% IV SOLN
2.0000 ug/min | INTRAVENOUS | Status: DC
  Filled 2024-05-30: qty 250

## 2024-05-30 MED ORDER — NOREPINEPHRINE 4 MG/250ML-% IV SOLN
0.0000 ug/min | INTRAVENOUS | Status: DC
  Filled 2024-05-30: qty 250

## 2024-05-30 MED ORDER — TRANEXAMIC ACID (OHS) PUMP PRIME SOLUTION
2.0000 mg/kg | INTRAVENOUS | Status: DC
  Filled 2024-05-30: qty 1.29

## 2024-05-30 MED ORDER — PHENYLEPHRINE HCL-NACL 20-0.9 MG/250ML-% IV SOLN
30.0000 ug/min | INTRAVENOUS | Status: AC
  Administered 2024-06-02: 30 ug/min via INTRAVENOUS
  Filled 2024-05-30: qty 250

## 2024-05-30 MED ORDER — EPINEPHRINE HCL 5 MG/250ML IV SOLN IN NS
0.0000 ug/min | INTRAVENOUS | Status: DC
  Filled 2024-05-30: qty 250

## 2024-05-30 MED ORDER — PLASMA-LYTE A IV SOLN
INTRAVENOUS | Status: DC
  Filled 2024-05-30: qty 2.5

## 2024-05-30 MED ORDER — VANCOMYCIN HCL 1250 MG/250ML IV SOLN
1250.0000 mg | INTRAVENOUS | Status: AC
  Administered 2024-06-02: 1250 mg via INTRAVENOUS
  Filled 2024-05-30: qty 250

## 2024-05-30 MED ORDER — MAGNESIUM SULFATE 50 % IJ SOLN
40.0000 meq | INTRAMUSCULAR | Status: DC
  Filled 2024-05-30: qty 9.85

## 2024-05-30 MED ORDER — DEXMEDETOMIDINE HCL IN NACL 400 MCG/100ML IV SOLN
0.1000 ug/kg/h | INTRAVENOUS | Status: AC
  Administered 2024-06-02: .5 ug/kg/h via INTRAVENOUS
  Filled 2024-05-30: qty 100

## 2024-05-30 MED ORDER — TRANEXAMIC ACID (OHS) BOLUS VIA INFUSION
15.0000 mg/kg | INTRAVENOUS | Status: AC
  Administered 2024-06-02: 964.5 mg via INTRAVENOUS
  Filled 2024-05-30: qty 965

## 2024-05-30 MED ORDER — TRANEXAMIC ACID 1000 MG/10ML IV SOLN
1.5000 mg/kg/h | INTRAVENOUS | Status: AC
  Administered 2024-06-02: 1.5 mg/kg/h via INTRAVENOUS
  Filled 2024-05-30: qty 25

## 2024-06-01 NOTE — H&P (Signed)
 "  [address] [place] [address]             [phone]     Cardiothoracic Surgery Admission History and Physical     PCP is Ina Marcellus RAMAN, MD Referring Provider is Elmira Elizabeth PARAS, MD       Chief Complaint  Patient presents with   Coronary Artery Disease            HPI:   The patient is a 69 year old woman with a history of hyperlipidemia, GERD, previous smoking until July 2024 with continued vaping, and coronary artery disease who was referred for consideration of CABG.  She was evaluated by cardiology and reported dyspnea on exertion which was felt to be an anginal equivalent and underwent a cardiac CT showing a coronary calcium  score of 523 which was 95th percentile with evidence of greater than 50% left main stenosis.  FFR was consistent with hemodynamically significant left main stenosis.  Cardiac catheterization on 05/06/2024 showed an 80% left main stenosis extending into the bifurcation.  The left circumflex was a large dominant vessel with no disease.  The right was a small nondominant vessel with no disease.  The LAD had mild calcification in the midportion without significant stenosis.   She lives with her husband.  She stays relatively active.  Her symptoms mainly occur with significant exertion like pushing her lawnmower or doing heavy work around the house.  She has no symptoms with routine activity or walking on level ground.  Her main symptom has been exertional shortness of breath and tightness in her chest     Past Medical History:  Diagnosis Date   Aortic atherosclerosis 03/11/2024   Backache 11/21/2023   BMI 26.0-26.9,adult     Cardiac CT  04/17/2024 calcium  score 524, CAD RADS 4 greater than 50% left main stenosis, abnormal CTFFR 04/21/2024   Chronic low back pain 12/12/2023   Chronic thoracic back pain 12/05/2023   Dislocation of scapulothoracic joint 12/12/2023   Dyspnea on exertion 03/11/2024   Hypercholesterolemia      Mitral valve stenosis     Pure hypercholesterolemia     Scapulalgia 12/05/2023   Scapular dysfunction 12/12/2023               Past Surgical History:  Procedure Laterality Date   CYST REMOVAL NECK       LEFT HEART CATH AND CORONARY ANGIOGRAPHY N/A 05/06/2024    Procedure: LEFT HEART CATH AND CORONARY ANGIOGRAPHY;  Surgeon: Elmira Elizabeth PARAS, MD;  Location: [facility];  Service: Cardiovascular;  Laterality: N/A;   TUBAL LIGATION                   Family History  Problem Relation Age of Onset   Hypertension Mother     Atrial fibrillation Mother     Heart attack Father     Hypertension Sister            Social History [Social History]  [Social History]      Tobacco Use   Smoking status: Every Day      Current packs/day: 1.00      Types: Cigarettes  Substance Use Topics   Alcohol use: No   Drug use: No           Current Outpatient Medications  Medication Sig Dispense Refill   aspirin  EC 81 MG tablet Take 1 tablet (81 mg total) by mouth daily. Swallow whole. 90  tablet 3   atorvastatin  (LIPITOR) 80 MG tablet Take 1 tablet (80 mg total) by mouth daily. 90 tablet 3   metoprolol  tartrate (LOPRESSOR ) 25 MG tablet Take 0.5 tablets (12.5 mg total) by mouth 2 (two) times daily. 180 tablet 3   traMADol  (ULTRAM ) 50 MG tablet Take 1 tablet (50 mg total) by mouth every 6 (six) hours as needed. 15 tablet 0      No current facility-administered medications for this visit.        [Allergies]  [Allergies]      Allergen Reactions   Latex Dermatitis   Meperidine        Other Reaction(s): Not available   meperidine   Morphine        Other Reaction(s): Not available   morphine     Review of Systems  Constitutional: Negative.   HENT: Negative.    Eyes: Negative.   Respiratory:  Positive for chest tightness and shortness of breath.   Cardiovascular:  Negative for chest pain and leg swelling.  Gastrointestinal: Negative.   Endocrine: Negative.    Genitourinary: Negative.   Musculoskeletal: Negative.   Neurological:  Negative for dizziness and syncope.  Hematological: Negative.   Psychiatric/Behavioral: Negative.       BP 119/66 (BP Location: Right Arm)   Pulse (!) 57   Resp 18   Ht 5' 1 (1.549 m)   Wt 140 lb (63.5 kg)   SpO2 99% Comment: RA  BMI 26.45 kg/m  Physical Exam Constitutional:      Appearance: Normal appearance.  HENT:     Head: Normocephalic and atraumatic.  Eyes:     Extraocular Movements: Extraocular movements intact.     Conjunctiva/sclera: Conjunctivae normal.     Pupils: Pupils are equal, round, and reactive to light.  Cardiovascular:     Rate and Rhythm: Normal rate and regular rhythm.     Pulses: Normal pulses.     Heart sounds: Normal heart sounds. No murmur heard. Pulmonary:     Effort: Pulmonary effort is normal.     Breath sounds: Normal breath sounds.  Abdominal:     General: There is no distension.     Tenderness: There is no abdominal tenderness.  Musculoskeletal:        General: No swelling.     Cervical back: Normal range of motion and neck supple.  Skin:    General: Skin is warm and dry.  Neurological:     General: No focal deficit present.     Mental Status: She is alert and oriented to person, place, and time.  Psychiatric:        Mood and Affect: Mood normal.        Behavior: Behavior normal.         Diagnostic Tests:   Procedures   LEFT HEART CATH AND CORONARY ANGIOGRAPHY    Conclusion   Coronary angiography 05/06/2024: LM: Proximal to distal 80% stenosis LAD: Proximal post stenotic dilatation, mild calcification in mid LAD without significant stenosis  Lcx: Large dominant vessel, no significant disease RCA: Small nondominant, no significant disease   LVEDP 18 mmHg      Conclusion: Severe left main disease   Recommendation: CVTS consult for CABG Avoid physical exertion Continue current medical management   Elizabeth JINNY Lawrence, MD       ECHOCARDIOGRAM REPORT       Patient Name:   Elizabeth Francis Date of Exam: 04/03/2024  Medical Rec #:  [id number]  Height:       60.0 in  Accession #:    [id number]        Weight:       142.8 lb  Date of Birth:  Aug 06, 1955         BSA:          1.618 m  Patient Age:    66 years          BP:           128/74 mmHg  Patient Gender: F                 HR:           67 bpm.  Exam Location:  [facility]   Procedure: 2D Echo, Cardiac Doppler, Color Doppler and Strain Analysis  (Both            Spectral and Color Flow Doppler were utilized during  procedure).   Indications:   Precordial pain [R07.2 (ICD-10-CM)], Dyspnea on exertion  [R06.09                 (ICD-10-CM)]    History:        Patient has no prior history of Echocardiogram  examinations.                 Signs/Symptoms:Dyspnea; Risk Factors:Former Smoker.    Sonographer:    Saddie Chimes  Referring Phys: [id number] SREEDHAR R MADIREDDY   IMPRESSIONS     1. Left ventricular ejection fraction, by estimation, is 55 to 60%. The  left ventricle has normal function. The left ventricle has no regional  wall motion abnormalities. Left ventricular diastolic parameters were  normal. The average left ventricular  global longitudinal strain is 19.4 %.   2. Right ventricular systolic function is normal. The right ventricular  size is normal. There is normal pulmonary artery systolic pressure.   3. The mitral valve is grossly normal. Trivial mitral valve  regurgitation. No evidence of mitral stenosis.   4. The aortic valve is grossly normal. Aortic valve regurgitation is not  visualized. No aortic stenosis is present.   5. The inferior vena cava is normal in size with greater than 50%  respiratory variability, suggesting right atrial pressure of 3 mmHg.   FINDINGS   Left Ventricle: Left ventricular ejection fraction, by estimation, is 55  to 60%. The left ventricle has normal function. The left ventricle has no  regional wall  motion abnormalities. The average left ventricular global  longitudinal strain is 19.4 %. The   left ventricular internal cavity size was normal in size. There is no  left ventricular hypertrophy. Left ventricular diastolic parameters were  normal.   Right Ventricle: The right ventricular size is normal. Right vetricular  wall thickness was not well visualized. Right ventricular systolic  function is normal. There is normal pulmonary artery systolic pressure.  The tricuspid regurgitant velocity is 2.01  m/s, and with an assumed right atrial pressure of 3 mmHg, the estimated  right ventricular systolic pressure is 19.2 mmHg.   Left Atrium: Left atrial size was normal in size.   Right Atrium: Right atrial size was normal in size.   Pericardium: There is no evidence of pericardial effusion.   Mitral Valve: The mitral valve is grossly normal. Trivial mitral valve  regurgitation. No evidence of mitral valve stenosis. MV peak gradient, 2.3  mmHg. The mean mitral valve gradient is 1.0 mmHg.   Tricuspid Valve: The tricuspid valve is grossly normal.  Tricuspid valve  regurgitation is trivial. No evidence of tricuspid stenosis.   Aortic Valve: The aortic valve is grossly normal. Aortic valve  regurgitation is not visualized. No aortic stenosis is present. Aortic  valve mean gradient measures 3.5 mmHg. Aortic valve peak gradient measures  6.2 mmHg. Aortic valve area, by VTI measures  2.05 cm.   Pulmonic Valve: The pulmonic valve was not well visualized. Pulmonic valve  regurgitation is not visualized. No evidence of pulmonic stenosis.   Aorta: The aortic root and ascending aorta are structurally normal, with  no evidence of dilitation.   Venous: The inferior vena cava is normal in size with greater than 50%  respiratory variability, suggesting right atrial pressure of 3 mmHg.   IAS/Shunts: The interatrial septum was not well visualized.     LEFT VENTRICLE  PLAX 2D  LVIDd:          4.20 cm   Diastology  LVIDs:         2.80 cm   LV e' medial:    12.10 cm/s  LV PW:         1.00 cm   LV E/e' medial:  5.7  LV IVS:        1.00 cm   LV e' lateral:   13.10 cm/s  LVOT diam:     1.90 cm   LV E/e' lateral: 5.3  LV SV:         58  LV SV Index:   36        2D Longitudinal Strain  LVOT Area:     2.84 cm  2D Strain GLS Avg:     19.4 %     RIGHT VENTRICLE  RV Basal diam:  3.10 cm  RV Mid diam:    2.10 cm  TAPSE (M-mode): 1.9 cm   LEFT ATRIUM           Index  LA diam:      2.80 cm 1.73 cm/m  LA Vol (A4C): 37.1 ml 22.93 ml/m   AORTIC VALVE                    PULMONIC VALVE  AV Area (Vmax):    2.10 cm     PV Vmax:       0.64 m/s  AV Area (Vmean):   2.00 cm     PV Vmean:      46.800 cm/s  AV Area (VTI):     2.05 cm     PV VTI:        0.170 m  AV Vmax:           124.00 cm/s  PV Peak grad:  1.7 mmHg  AV Vmean:          82.700 cm/s  PV Mean grad:  1.0 mmHg  AV VTI:            0.282 m  AV Peak Grad:      6.2 mmHg  AV Mean Grad:      3.5 mmHg  LVOT Vmax:         91.95 cm/s  LVOT Vmean:        58.450 cm/s  LVOT VTI:          0.204 m  LVOT/AV VTI ratio: 0.72    AORTA  Ao Asc diam: 3.05 cm   MITRAL VALVE               TRICUSPID VALVE  MV Area (  PHT): 1.96 cm    TR Peak grad:   16.2 mmHg  MV Area VTI:   1.40 cm    TR Vmax:        201.00 cm/s  MV Peak grad:  2.3 mmHg  MV Mean grad:  1.0 mmHg    SHUNTS  MV Vmax:       0.76 m/s    Systemic VTI:  0.20 m  MV Vmean:      46.8 cm/s   Systemic Diam: 1.90 cm  MV Decel Time: 387 msec  MV E velocity: 69.20 cm/s  MV A velocity: 60.80 cm/s  MV E/A ratio:  1.14   Sreedhar reddy Madireddy  Electronically signed by Alean reddy Madireddy  Signature Date/Time: 04/04/2024/5:24:48 PM        Final        Impression:   This 69 year old woman has high-grade left main coronary stenosis with a significant drop in FFR in the LAD to 0.6 approximately to distally and left circumflex territory to 0.61 proximally to distally.  Left  ventricular ejection fraction is normal.  I agree that coronary artery bypass graft surgery is the best treatment to resolve her symptoms and prevent massive myocardial infarction and sudden death. I discussed the operative procedure with the patient and her husband including alternatives, benefits and risks; including but not limited to bleeding, blood transfusion, infection, stroke, myocardial infarction, graft failure, heart block requiring a permanent pacemaker, organ dysfunction, and death.  Elizabeth Francis understands and agrees to proceed.    Plan:   Coronary bypass graft surgery.    Dorise MARLA Fellers, MD [facility] [phone] "

## 2024-06-02 ENCOUNTER — Inpatient Hospital Stay (HOSPITAL_COMMUNITY)

## 2024-06-02 ENCOUNTER — Inpatient Hospital Stay (HOSPITAL_COMMUNITY): Payer: Self-pay | Admitting: Physician Assistant

## 2024-06-02 ENCOUNTER — Inpatient Hospital Stay (HOSPITAL_COMMUNITY)
Admission: RE | Admit: 2024-06-02 | Discharge: 2024-06-06 | DRG: 236 | Disposition: A | Discharge status: Home Health | Attending: Surgery | Admitting: Surgery

## 2024-06-02 ENCOUNTER — Encounter (HOSPITAL_COMMUNITY)
Admission: RE | Disposition: A | Discharge status: Home Health | Payer: Self-pay | Source: Home / Self Care | Attending: Surgery

## 2024-06-02 DIAGNOSIS — Z951 Presence of aortocoronary bypass graft: Secondary | ICD-10-CM | POA: Diagnosis not present

## 2024-06-02 DIAGNOSIS — I050 Rheumatic mitral stenosis: Secondary | ICD-10-CM | POA: Diagnosis present

## 2024-06-02 DIAGNOSIS — G8929 Other chronic pain: Secondary | ICD-10-CM | POA: Diagnosis present

## 2024-06-02 DIAGNOSIS — Z7982 Long term (current) use of aspirin: Secondary | ICD-10-CM

## 2024-06-02 DIAGNOSIS — Z79899 Other long term (current) drug therapy: Secondary | ICD-10-CM | POA: Diagnosis not present

## 2024-06-02 DIAGNOSIS — Z91040 Latex allergy status: Secondary | ICD-10-CM | POA: Diagnosis not present

## 2024-06-02 DIAGNOSIS — K219 Gastro-esophageal reflux disease without esophagitis: Secondary | ICD-10-CM | POA: Diagnosis present

## 2024-06-02 DIAGNOSIS — Z8701 Personal history of pneumonia (recurrent): Secondary | ICD-10-CM

## 2024-06-02 DIAGNOSIS — F17290 Nicotine dependence, other tobacco product, uncomplicated: Secondary | ICD-10-CM | POA: Diagnosis present

## 2024-06-02 DIAGNOSIS — I2510 Atherosclerotic heart disease of native coronary artery without angina pectoris: Secondary | ICD-10-CM | POA: Diagnosis present

## 2024-06-02 DIAGNOSIS — J9811 Atelectasis: Secondary | ICD-10-CM | POA: Diagnosis not present

## 2024-06-02 DIAGNOSIS — Z888 Allergy status to other drugs, medicaments and biological substances status: Secondary | ICD-10-CM

## 2024-06-02 DIAGNOSIS — J9589 Other postprocedural complications and disorders of respiratory system, not elsewhere classified: Secondary | ICD-10-CM | POA: Diagnosis not present

## 2024-06-02 DIAGNOSIS — Z8249 Family history of ischemic heart disease and other diseases of the circulatory system: Secondary | ICD-10-CM | POA: Diagnosis not present

## 2024-06-02 DIAGNOSIS — E7800 Pure hypercholesterolemia, unspecified: Secondary | ICD-10-CM

## 2024-06-02 DIAGNOSIS — Z96652 Presence of left artificial knee joint: Secondary | ICD-10-CM | POA: Diagnosis present

## 2024-06-02 DIAGNOSIS — R0689 Other abnormalities of breathing: Secondary | ICD-10-CM | POA: Diagnosis not present

## 2024-06-02 DIAGNOSIS — R112 Nausea with vomiting, unspecified: Secondary | ICD-10-CM | POA: Diagnosis not present

## 2024-06-02 DIAGNOSIS — I25118 Atherosclerotic heart disease of native coronary artery with other forms of angina pectoris: Secondary | ICD-10-CM | POA: Diagnosis present

## 2024-06-02 DIAGNOSIS — D62 Acute posthemorrhagic anemia: Secondary | ICD-10-CM | POA: Diagnosis not present

## 2024-06-02 DIAGNOSIS — D6959 Other secondary thrombocytopenia: Secondary | ICD-10-CM | POA: Diagnosis not present

## 2024-06-02 DIAGNOSIS — E785 Hyperlipidemia, unspecified: Secondary | ICD-10-CM

## 2024-06-02 DIAGNOSIS — Z885 Allergy status to narcotic agent status: Secondary | ICD-10-CM

## 2024-06-02 DIAGNOSIS — Z87891 Personal history of nicotine dependence: Secondary | ICD-10-CM

## 2024-06-02 DIAGNOSIS — E8770 Fluid overload, unspecified: Secondary | ICD-10-CM | POA: Diagnosis present

## 2024-06-02 HISTORY — PX: CORONARY ARTERY BYPASS GRAFT: SHX141

## 2024-06-02 HISTORY — PX: INTRAOPERATIVE TRANSESOPHAGEAL ECHOCARDIOGRAM: SHX5062

## 2024-06-02 LAB — POCT I-STAT 7, (LYTES, BLD GAS, ICA,H+H)
Acid-Base Excess: 1 mmol/L (ref 0.0–2.0)
Acid-base deficit: 4 mmol/L — ABNORMAL HIGH (ref 0.0–2.0)
Acid-base deficit: 2 mmol/L (ref 0.0–2.0)
Acid-base deficit: 4 mmol/L — ABNORMAL HIGH (ref 0.0–2.0)
Acid-base deficit: 5 mmol/L — ABNORMAL HIGH (ref 0.0–2.0)
Acid-base deficit: 1 mmol/L (ref 0.0–2.0)
Acid-base deficit: 1 mmol/L (ref 0.0–2.0)
Bicarbonate: 25.1 mmol/L (ref 20.0–28.0)
Bicarbonate: 20 mmol/L (ref 20.0–28.0)
Bicarbonate: 22.8 mmol/L (ref 20.0–28.0)
Bicarbonate: 22.2 mmol/L (ref 20.0–28.0)
Bicarbonate: 21.9 mmol/L (ref 20.0–28.0)
Bicarbonate: 26.3 mmol/L (ref 20.0–28.0)
Bicarbonate: 25.1 mmol/L (ref 20.0–28.0)
Calcium, Ion: 1.29 mmol/L (ref 1.15–1.40)
Calcium, Ion: 0.97 mmol/L — ABNORMAL LOW (ref 1.15–1.40)
Calcium, Ion: 1.13 mmol/L — ABNORMAL LOW (ref 1.15–1.40)
Calcium, Ion: 1.14 mmol/L — ABNORMAL LOW (ref 1.15–1.40)
Calcium, Ion: 1.17 mmol/L (ref 1.15–1.40)
Calcium, Ion: 1.15 mmol/L (ref 1.15–1.40)
Calcium, Ion: 1.12 mmol/L — ABNORMAL LOW (ref 1.15–1.40)
HCT: 30 % — ABNORMAL LOW (ref 36.0–46.0)
HCT: 22 % — ABNORMAL LOW (ref 36.0–46.0)
HCT: 24 % — ABNORMAL LOW (ref 36.0–46.0)
HCT: 22 % — ABNORMAL LOW (ref 36.0–46.0)
HCT: 26 % — ABNORMAL LOW (ref 36.0–46.0)
HCT: 25 % — ABNORMAL LOW (ref 36.0–46.0)
HCT: 22 % — ABNORMAL LOW (ref 36.0–46.0)
Hemoglobin: 10.2 g/dL — ABNORMAL LOW (ref 12.0–15.0)
Hemoglobin: 7.5 g/dL — ABNORMAL LOW (ref 12.0–15.0)
Hemoglobin: 8.2 g/dL — ABNORMAL LOW (ref 12.0–15.0)
Hemoglobin: 7.5 g/dL — ABNORMAL LOW (ref 12.0–15.0)
Hemoglobin: 8.8 g/dL — ABNORMAL LOW (ref 12.0–15.0)
Hemoglobin: 8.5 g/dL — ABNORMAL LOW (ref 12.0–15.0)
Hemoglobin: 7.5 g/dL — ABNORMAL LOW (ref 12.0–15.0)
O2 Saturation: 100 %
O2 Saturation: 100 %
O2 Saturation: 100 %
O2 Saturation: 63 %
O2 Saturation: 100 %
O2 Saturation: 100 %
O2 Saturation: 98 %
Patient temperature: 35.4
Patient temperature: 35.3
Patient temperature: 36.5
Patient temperature: 35.4
Potassium: 4.2 mmol/L (ref 3.5–5.1)
Potassium: 4.7 mmol/L (ref 3.5–5.1)
Potassium: 5.5 mmol/L — ABNORMAL HIGH (ref 3.5–5.1)
Potassium: 4.1 mmol/L (ref 3.5–5.1)
Potassium: 4.2 mmol/L (ref 3.5–5.1)
Potassium: 4.5 mmol/L (ref 3.5–5.1)
Potassium: 4.3 mmol/L (ref 3.5–5.1)
Sodium: 139 mmol/L (ref 135–145)
Sodium: 136 mmol/L (ref 135–145)
Sodium: 136 mmol/L (ref 135–145)
Sodium: 140 mmol/L (ref 135–145)
Sodium: 141 mmol/L (ref 135–145)
Sodium: 142 mmol/L (ref 135–145)
Sodium: 141 mmol/L (ref 135–145)
TCO2: 26 mmol/L (ref 22–32)
TCO2: 21 mmol/L — ABNORMAL LOW (ref 22–32)
TCO2: 24 mmol/L (ref 22–32)
TCO2: 23 mmol/L (ref 22–32)
TCO2: 23 mmol/L (ref 22–32)
TCO2: 28 mmol/L (ref 22–32)
TCO2: 27 mmol/L (ref 22–32)
pCO2 arterial: 39.2 mmHg (ref 32–48)
pCO2 arterial: 29.9 mmHg — ABNORMAL LOW (ref 32–48)
pCO2 arterial: 35.6 mmHg (ref 32–48)
pCO2 arterial: 39.8 mmHg (ref 32–48)
pCO2 arterial: 45.4 mmHg (ref 32–48)
pCO2 arterial: 55.2 mmHg — ABNORMAL HIGH (ref 32–48)
pCO2 arterial: 46.7 mmHg (ref 32–48)
pH, Arterial: 7.415 (ref 7.35–7.45)
pH, Arterial: 7.434 (ref 7.35–7.45)
pH, Arterial: 7.413 (ref 7.35–7.45)
pH, Arterial: 7.346 — ABNORMAL LOW (ref 7.35–7.45)
pH, Arterial: 7.283 — ABNORMAL LOW (ref 7.35–7.45)
pH, Arterial: 7.283 — ABNORMAL LOW (ref 7.35–7.45)
pH, Arterial: 7.33 — ABNORMAL LOW (ref 7.35–7.45)
pO2, Arterial: 371 mmHg — ABNORMAL HIGH (ref 83–108)
pO2, Arterial: 335 mmHg — ABNORMAL HIGH (ref 83–108)
pO2, Arterial: 235 mmHg — ABNORMAL HIGH (ref 83–108)
pO2, Arterial: 32 mmHg — CL (ref 83–108)
pO2, Arterial: 200 mmHg — ABNORMAL HIGH (ref 83–108)
pO2, Arterial: 214 mmHg — ABNORMAL HIGH (ref 83–108)
pO2, Arterial: 112 mmHg — ABNORMAL HIGH (ref 83–108)

## 2024-06-02 LAB — CBC
HCT: 27.3 % — ABNORMAL LOW (ref 36.0–46.0)
HCT: 25.3 % — ABNORMAL LOW (ref 36.0–46.0)
Hemoglobin: 8.7 g/dL — ABNORMAL LOW (ref 12.0–15.0)
Hemoglobin: 8.2 g/dL — ABNORMAL LOW (ref 12.0–15.0)
MCH: 26.5 pg (ref 26.0–34.0)
MCH: 27 pg (ref 26.0–34.0)
MCHC: 31.9 g/dL (ref 30.0–36.0)
MCHC: 32.4 g/dL (ref 30.0–36.0)
MCV: 83.2 fL (ref 80.0–100.0)
MCV: 83.2 fL (ref 80.0–100.0)
Platelets: 114 K/uL — ABNORMAL LOW (ref 150–400)
Platelets: 87 K/uL — ABNORMAL LOW (ref 150–400)
RBC: 3.28 MIL/uL — ABNORMAL LOW (ref 3.87–5.11)
RBC: 3.04 MIL/uL — ABNORMAL LOW (ref 3.87–5.11)
RDW: 14.3 % (ref 11.5–15.5)
RDW: 14.1 % (ref 11.5–15.5)
WBC: 11.7 K/uL — ABNORMAL HIGH (ref 4.0–10.5)
WBC: 9.9 K/uL (ref 4.0–10.5)
nRBC: 0 % (ref 0.0–0.2)
nRBC: 0 % (ref 0.0–0.2)

## 2024-06-02 LAB — POCT I-STAT, CHEM 8
BUN: 10 mg/dL (ref 8–23)
BUN: 9 mg/dL (ref 8–23)
BUN: 9 mg/dL (ref 8–23)
Calcium, Ion: 1.31 mmol/L (ref 1.15–1.40)
Calcium, Ion: 1.28 mmol/L (ref 1.15–1.40)
Calcium, Ion: 1.09 mmol/L — ABNORMAL LOW (ref 1.15–1.40)
Chloride: 105 mmol/L (ref 98–111)
Chloride: 104 mmol/L (ref 98–111)
Chloride: 102 mmol/L (ref 98–111)
Creatinine, Ser: 0.8 mg/dL (ref 0.44–1.00)
Creatinine, Ser: 0.7 mg/dL (ref 0.44–1.00)
Creatinine, Ser: 0.7 mg/dL (ref 0.44–1.00)
Glucose, Bld: 102 mg/dL — ABNORMAL HIGH (ref 70–99)
Glucose, Bld: 102 mg/dL — ABNORMAL HIGH (ref 70–99)
Glucose, Bld: 121 mg/dL — ABNORMAL HIGH (ref 70–99)
HCT: 32 % — ABNORMAL LOW (ref 36.0–46.0)
HCT: 27 % — ABNORMAL LOW (ref 36.0–46.0)
HCT: 21 % — ABNORMAL LOW (ref 36.0–46.0)
Hemoglobin: 10.9 g/dL — ABNORMAL LOW (ref 12.0–15.0)
Hemoglobin: 9.2 g/dL — ABNORMAL LOW (ref 12.0–15.0)
Hemoglobin: 7.1 g/dL — ABNORMAL LOW (ref 12.0–15.0)
Potassium: 4.1 mmol/L (ref 3.5–5.1)
Potassium: 4 mmol/L (ref 3.5–5.1)
Potassium: 5.7 mmol/L — ABNORMAL HIGH (ref 3.5–5.1)
Sodium: 140 mmol/L (ref 135–145)
Sodium: 139 mmol/L (ref 135–145)
Sodium: 136 mmol/L (ref 135–145)
TCO2: 25 mmol/L (ref 22–32)
TCO2: 25 mmol/L (ref 22–32)
TCO2: 25 mmol/L (ref 22–32)

## 2024-06-02 LAB — PREPARE RBC (CROSSMATCH)

## 2024-06-02 LAB — ABO/RH: ABO/RH(D): A POS

## 2024-06-02 LAB — GLUCOSE, CAPILLARY
Glucose-Capillary: 136 mg/dL — ABNORMAL HIGH (ref 70–99)
Glucose-Capillary: 111 mg/dL — ABNORMAL HIGH (ref 70–99)
Glucose-Capillary: 150 mg/dL — ABNORMAL HIGH (ref 70–99)
Glucose-Capillary: 185 mg/dL — ABNORMAL HIGH (ref 70–99)
Glucose-Capillary: 165 mg/dL — ABNORMAL HIGH (ref 70–99)
Glucose-Capillary: 160 mg/dL — ABNORMAL HIGH (ref 70–99)

## 2024-06-02 LAB — PROTIME-INR
INR: 1.3 — ABNORMAL HIGH (ref 0.8–1.2)
Prothrombin Time: 17.1 s — ABNORMAL HIGH (ref 11.4–15.2)

## 2024-06-02 LAB — APTT: aPTT: 35 s (ref 24–36)

## 2024-06-02 LAB — MAGNESIUM: Magnesium: 3.5 mg/dL — ABNORMAL HIGH (ref 1.7–2.4)

## 2024-06-02 LAB — BASIC METABOLIC PANEL WITH GFR
Anion gap: 8 (ref 5–15)
BUN: 9 mg/dL (ref 8–23)
CO2: 27 mmol/L (ref 22–32)
Calcium: 7.6 mg/dL — ABNORMAL LOW (ref 8.9–10.3)
Chloride: 108 mmol/L (ref 98–111)
Creatinine, Ser: 0.75 mg/dL (ref 0.44–1.00)
GFR, Estimated: >60 mL/min
Glucose, Bld: 149 mg/dL — ABNORMAL HIGH (ref 70–99)
Potassium: 4.7 mmol/L (ref 3.5–5.1)
Sodium: 143 mmol/L (ref 135–145)

## 2024-06-02 LAB — HEMOGLOBIN AND HEMATOCRIT, BLOOD
HCT: 24.8 % — ABNORMAL LOW (ref 36.0–46.0)
Hemoglobin: 7.7 g/dL — ABNORMAL LOW (ref 12.0–15.0)

## 2024-06-02 LAB — PLATELET COUNT: Platelets: 166 K/uL (ref 150–400)

## 2024-06-02 MED ORDER — SODIUM CHLORIDE 0.9 % IV SOLN: 250.0000 mL | INTRAVENOUS | Status: AC

## 2024-06-02 MED ORDER — PHENYLEPHRINE HCL-NACL 20-0.9 MG/250ML-% IV SOLN: 0.0000 ug/min | INTRAVENOUS | Status: DC

## 2024-06-02 MED ORDER — ~~LOC~~ CARDIAC SURGERY, PATIENT & FAMILY EDUCATION
Freq: Once | Status: DC
  Filled 2024-06-02: qty 1

## 2024-06-02 MED ORDER — HEPARIN SODIUM (PORCINE) 1000 UNIT/ML IJ SOLN
INTRAMUSCULAR | Status: DC | PRN
  Administered 2024-06-02: 22000 [IU] via INTRAVENOUS

## 2024-06-02 MED ORDER — TRAMADOL HCL 50 MG PO TABS
50.0000 mg | ORAL_TABLET | ORAL | Status: DC | PRN
  Administered 2024-06-03 – 2024-06-04 (×3): 50 mg via ORAL
  Filled 2024-06-02 (×3): qty 1

## 2024-06-02 MED ORDER — SUGAMMADEX SODIUM 200 MG/2ML IV SOLN
INTRAVENOUS | Status: DC | PRN
  Administered 2024-06-02: 150 mg via INTRAVENOUS

## 2024-06-02 MED ORDER — CHLORHEXIDINE GLUCONATE CLOTH 2 % EX PADS
6.0000 | MEDICATED_PAD | Freq: Every day | CUTANEOUS | Status: DC
  Administered 2024-06-02 – 2024-06-05 (×4): 6 via TOPICAL

## 2024-06-02 MED ORDER — SODIUM CHLORIDE 0.9% IV SOLUTION: Freq: Once | INTRAVENOUS | Status: AC

## 2024-06-02 MED ORDER — ATORVASTATIN CALCIUM 80 MG PO TABS
80.0000 mg | ORAL_TABLET | Freq: Every day | ORAL | Status: DC
  Administered 2024-06-04 – 2024-06-06 (×3): 80 mg via ORAL
  Filled 2024-06-02 (×4): qty 1

## 2024-06-02 MED ORDER — PANTOPRAZOLE SODIUM 40 MG IV SOLR
40.0000 mg | Freq: Every day | INTRAVENOUS | Status: AC
  Administered 2024-06-02 – 2024-06-03 (×2): 40 mg via INTRAVENOUS
  Filled 2024-06-02 (×2): qty 10

## 2024-06-02 MED ORDER — GLYCOPYRROLATE PF 0.2 MG/ML IJ SOSY
PREFILLED_SYRINGE | INTRAMUSCULAR | Status: AC
  Filled 2024-06-02: qty 1

## 2024-06-02 MED ORDER — CEFAZOLIN SODIUM-DEXTROSE 2-4 GM/100ML-% IV SOLN
2.0000 g | Freq: Three times a day (TID) | INTRAVENOUS | Status: AC
  Administered 2024-06-02 – 2024-06-04 (×6): 2 g via INTRAVENOUS
  Filled 2024-06-02 (×6): qty 100

## 2024-06-02 MED ORDER — CHLORHEXIDINE GLUCONATE 4 % EX SOLN: 30.0000 mL | CUTANEOUS | Status: DC

## 2024-06-02 MED ORDER — HEMOSTATIC AGENTS (NO CHARGE) OPTIME
TOPICAL | Status: DC | PRN
  Administered 2024-06-02: 1 via TOPICAL

## 2024-06-02 MED ORDER — CHLORHEXIDINE GLUCONATE 0.12 % MT SOLN
15.0000 mL | OROMUCOSAL | Status: AC
  Administered 2024-06-02: 15 mL via OROMUCOSAL
  Filled 2024-06-02: qty 15

## 2024-06-02 MED ORDER — EPHEDRINE SULFATE-NACL 50-0.9 MG/10ML-% IV SOSY
PREFILLED_SYRINGE | INTRAVENOUS | Status: DC | PRN
  Administered 2024-06-02 (×2): 2.5 mg via INTRAVENOUS

## 2024-06-02 MED ORDER — PHENYLEPHRINE 80 MCG/ML (10ML) SYRINGE FOR IV PUSH (FOR BLOOD PRESSURE SUPPORT)
PREFILLED_SYRINGE | INTRAVENOUS | Status: AC
  Filled 2024-06-02: qty 10

## 2024-06-02 MED ORDER — BISACODYL 10 MG RE SUPP
10.0000 mg | Freq: Every day | RECTAL | Status: DC
  Filled 2024-06-02: qty 1

## 2024-06-02 MED ORDER — PHENYLEPHRINE 80 MCG/ML (10ML) SYRINGE FOR IV PUSH (FOR BLOOD PRESSURE SUPPORT)
PREFILLED_SYRINGE | INTRAVENOUS | Status: DC | PRN
  Administered 2024-06-02 (×2): 80 ug via INTRAVENOUS

## 2024-06-02 MED ORDER — ONDANSETRON HCL 4 MG/2ML IJ SOLN
4.0000 mg | Freq: Four times a day (QID) | INTRAMUSCULAR | Status: DC | PRN
  Administered 2024-06-02 – 2024-06-03 (×4): 4 mg via INTRAVENOUS
  Filled 2024-06-02 (×4): qty 2

## 2024-06-02 MED ORDER — SODIUM CHLORIDE 0.9% FLUSH
3.0000 mL | Freq: Two times a day (BID) | INTRAVENOUS | Status: DC
  Administered 2024-06-03 – 2024-06-05 (×5): 3 mL via INTRAVENOUS

## 2024-06-02 MED ORDER — LACTATED RINGERS IV SOLN: INTRAVENOUS | Status: AC

## 2024-06-02 MED ORDER — PROTAMINE SULFATE 10 MG/ML IV SOLN
INTRAVENOUS | Status: AC
  Filled 2024-06-02: qty 25

## 2024-06-02 MED ORDER — ACETAMINOPHEN 160 MG/5ML PO SOLN: 1000.0000 mg | Freq: Four times a day (QID) | ORAL | Status: DC

## 2024-06-02 MED ORDER — EPHEDRINE 5 MG/ML INJ
INTRAVENOUS | Status: AC
  Filled 2024-06-02: qty 5

## 2024-06-02 MED ORDER — DEXTROSE 50 % IV SOLN: 0.0000 mL | INTRAVENOUS | Status: DC | PRN

## 2024-06-02 MED ORDER — GLYCOPYRROLATE 0.2 MG/ML IJ SOLN
INTRAMUSCULAR | Status: DC | PRN
  Administered 2024-06-02 (×2): .1 mg via INTRAVENOUS

## 2024-06-02 MED ORDER — VANCOMYCIN HCL IN DEXTROSE 1-5 GM/200ML-% IV SOLN
1000.0000 mg | Freq: Once | INTRAVENOUS | Status: AC
  Administered 2024-06-02: 1000 mg via INTRAVENOUS
  Filled 2024-06-02: qty 200

## 2024-06-02 MED ORDER — VASOPRESSIN 20 UNITS/100 ML INFUSION FOR SHOCK
0.0000 [IU]/min | INTRAVENOUS | Status: DC
  Administered 2024-06-02: 0.04 [IU]/min via INTRAVENOUS
  Administered 2024-06-03: 0.03 [IU]/min via INTRAVENOUS
  Filled 2024-06-02 (×2): qty 100

## 2024-06-02 MED ORDER — SODIUM CHLORIDE 0.9% FLUSH: 3.0000 mL | INTRAVENOUS | Status: DC | PRN

## 2024-06-02 MED ORDER — VASOPRESSIN 20 UNITS/100 ML INFUSION FOR SHOCK
INTRAVENOUS | Status: AC
  Administered 2024-06-02: 0.03 [IU]/min via INTRAVENOUS
  Filled 2024-06-02: qty 100

## 2024-06-02 MED ORDER — NOREPINEPHRINE 4 MG/250ML-% IV SOLN: 0.0000 ug/min | INTRAVENOUS | Status: DC

## 2024-06-02 MED ORDER — ASPIRIN 325 MG PO TBEC: 325.0000 mg | DELAYED_RELEASE_TABLET | Freq: Every day | ORAL | Status: DC

## 2024-06-02 MED ORDER — MAGNESIUM SULFATE 4 GM/100ML IV SOLN
4.0000 g | Freq: Once | INTRAVENOUS | Status: AC
  Administered 2024-06-02: 4 g via INTRAVENOUS
  Filled 2024-06-02: qty 100

## 2024-06-02 MED ORDER — METOCLOPRAMIDE HCL 5 MG/ML IJ SOLN
10.0000 mg | Freq: Four times a day (QID) | INTRAMUSCULAR | Status: AC
  Administered 2024-06-02 – 2024-06-05 (×12): 10 mg via INTRAVENOUS
  Filled 2024-06-02 (×11): qty 2

## 2024-06-02 MED ORDER — FENTANYL CITRATE (PF) 250 MCG/5ML IJ SOLN
INTRAMUSCULAR | Status: AC
  Filled 2024-06-02: qty 5

## 2024-06-02 MED ORDER — SODIUM BICARBONATE 8.4 % IV SOLN
100.0000 meq | Freq: Once | INTRAVENOUS | Status: AC
  Administered 2024-06-02: 100 meq via INTRAVENOUS

## 2024-06-02 MED ORDER — METOPROLOL TARTRATE 5 MG/5ML IV SOLN: 2.5000 mg | INTRAVENOUS | Status: DC | PRN

## 2024-06-02 MED ORDER — METOPROLOL TARTRATE 12.5 MG HALF TABLET: 12.5000 mg | ORAL_TABLET | Freq: Two times a day (BID) | ORAL | Status: DC

## 2024-06-02 MED ORDER — ACETAMINOPHEN 160 MG/5ML PO SOLN
650.0000 mg | Freq: Once | ORAL | Status: AC
  Administered 2024-06-02: 650 mg
  Filled 2024-06-02: qty 20.3

## 2024-06-02 MED ORDER — BISACODYL 5 MG PO TBEC
10.0000 mg | DELAYED_RELEASE_TABLET | Freq: Every day | ORAL | Status: DC
  Administered 2024-06-04 – 2024-06-05 (×2): 10 mg via ORAL
  Filled 2024-06-02 (×3): qty 2

## 2024-06-02 MED ORDER — DOCUSATE SODIUM 100 MG PO CAPS
200.0000 mg | ORAL_CAPSULE | Freq: Every day | ORAL | Status: DC
  Administered 2024-06-04 – 2024-06-05 (×2): 200 mg via ORAL
  Filled 2024-06-02 (×3): qty 2

## 2024-06-02 MED ORDER — PROPOFOL 10 MG/ML IV BOLUS
INTRAVENOUS | Status: DC | PRN
  Administered 2024-06-02: 40 mg via INTRAVENOUS
  Administered 2024-06-02: 10 mg via INTRAVENOUS

## 2024-06-02 MED ORDER — MIDAZOLAM HCL (PF) 5 MG/ML IJ SOLN
INTRAMUSCULAR | Status: DC | PRN
  Administered 2024-06-02 (×4): 1 mg via INTRAVENOUS

## 2024-06-02 MED ORDER — THROMBIN (RECOMBINANT) 20000 UNITS EX SOLR
CUTANEOUS | Status: AC
  Filled 2024-06-02: qty 20000

## 2024-06-02 MED ORDER — MIDAZOLAM HCL (PF) 2 MG/2ML IJ SOLN
2.0000 mg | INTRAMUSCULAR | Status: DC | PRN
  Administered 2024-06-02: 2 mg via INTRAVENOUS
  Administered 2024-06-02: 1 mg via INTRAVENOUS
  Filled 2024-06-02: qty 2

## 2024-06-02 MED ORDER — ASPIRIN 81 MG PO CHEW: 324.0000 mg | CHEWABLE_TABLET | Freq: Every day | ORAL | Status: DC

## 2024-06-02 MED ORDER — METOCLOPRAMIDE HCL 5 MG/ML IJ SOLN
INTRAMUSCULAR | Status: AC
  Filled 2024-06-02: qty 2

## 2024-06-02 MED ORDER — SODIUM CHLORIDE 0.9 % IV SOLN: INTRAVENOUS | Status: AC

## 2024-06-02 MED ORDER — ASPIRIN 81 MG PO CHEW
324.0000 mg | CHEWABLE_TABLET | Freq: Once | ORAL | Status: AC
  Administered 2024-06-02: 324 mg via ORAL
  Filled 2024-06-02: qty 4

## 2024-06-02 MED ORDER — METOPROLOL TARTRATE 12.5 MG HALF TABLET
12.5000 mg | ORAL_TABLET | Freq: Once | ORAL | Status: DC
  Filled 2024-06-02: qty 1

## 2024-06-02 MED ORDER — THROMBIN 20000 UNITS EX SOLR
CUTANEOUS | Status: DC | PRN
  Administered 2024-06-02: 20000 [IU] via TOPICAL

## 2024-06-02 MED ORDER — ROCURONIUM BROMIDE 10 MG/ML (PF) SYRINGE
PREFILLED_SYRINGE | INTRAVENOUS | Status: DC | PRN
  Administered 2024-06-02: 50 mg via INTRAVENOUS
  Administered 2024-06-02: 20 mg via INTRAVENOUS
  Administered 2024-06-02: 50 mg via INTRAVENOUS
  Administered 2024-06-02: 30 mg via INTRAVENOUS

## 2024-06-02 MED ORDER — PROPOFOL 10 MG/ML IV BOLUS
INTRAVENOUS | Status: AC
  Filled 2024-06-02: qty 20

## 2024-06-02 MED ORDER — METOPROLOL TARTRATE 25 MG/10 ML ORAL SUSPENSION: 12.5000 mg | Freq: Two times a day (BID) | ORAL | Status: DC

## 2024-06-02 MED ORDER — POTASSIUM CHLORIDE 10 MEQ/50ML IV SOLN: 10.0000 meq | INTRAVENOUS | Status: DC

## 2024-06-02 MED ORDER — NITROGLYCERIN IN D5W 200-5 MCG/ML-% IV SOLN
0.0000 ug/min | INTRAVENOUS | Status: DC
  Administered 2024-06-02: 0 ug/min via INTRAVENOUS

## 2024-06-02 MED ORDER — ORAL CARE MOUTH RINSE: 15.0000 mL | Freq: Once | OROMUCOSAL | Status: AC

## 2024-06-02 MED ORDER — MIDAZOLAM HCL (PF) 10 MG/2ML IJ SOLN
INTRAMUSCULAR | Status: AC
  Filled 2024-06-02: qty 2

## 2024-06-02 MED ORDER — FENTANYL CITRATE (PF) 50 MCG/ML IJ SOSY
50.0000 ug | PREFILLED_SYRINGE | INTRAMUSCULAR | Status: DC | PRN
  Administered 2024-06-02 (×3): 50 ug via INTRAVENOUS
  Filled 2024-06-02: qty 2
  Filled 2024-06-02: qty 1

## 2024-06-02 MED ORDER — FENTANYL CITRATE (PF) 250 MCG/5ML IJ SOLN
INTRAMUSCULAR | Status: DC | PRN
  Administered 2024-06-02 (×2): 100 ug via INTRAVENOUS
  Administered 2024-06-02: 150 ug via INTRAVENOUS
  Administered 2024-06-02 (×2): 50 ug via INTRAVENOUS
  Administered 2024-06-02: 100 ug via INTRAVENOUS
  Administered 2024-06-02: 50 ug via INTRAVENOUS
  Administered 2024-06-02: 150 ug via INTRAVENOUS

## 2024-06-02 MED ORDER — PLASMA-LYTE A IV SOLN
INTRAVENOUS | Status: DC | PRN
  Administered 2024-06-02: 500 mL

## 2024-06-02 MED ORDER — PANTOPRAZOLE SODIUM 40 MG PO TBEC
40.0000 mg | DELAYED_RELEASE_TABLET | Freq: Every day | ORAL | Status: DC
  Administered 2024-06-04 – 2024-06-06 (×3): 40 mg via ORAL
  Filled 2024-06-02 (×3): qty 1

## 2024-06-02 MED ORDER — INSULIN REGULAR(HUMAN) IN NACL 100-0.9 UT/100ML-% IV SOLN
INTRAVENOUS | Status: DC
  Administered 2024-06-02: 0.8 [IU]/h via INTRAVENOUS

## 2024-06-02 MED ORDER — LACTATED RINGERS IV SOLN: INTRAVENOUS | Status: DC

## 2024-06-02 MED ORDER — HEPARIN SODIUM (PORCINE) 1000 UNIT/ML IJ SOLN
INTRAMUSCULAR | Status: AC
  Filled 2024-06-02: qty 1

## 2024-06-02 MED ORDER — DEXMEDETOMIDINE HCL IN NACL 400 MCG/100ML IV SOLN
0.0000 ug/kg/h | INTRAVENOUS | Status: DC
  Administered 2024-06-02: 0.2 ug/kg/h via INTRAVENOUS
  Filled 2024-06-02: qty 100

## 2024-06-02 MED ORDER — ACETAMINOPHEN 500 MG PO TABS
1000.0000 mg | ORAL_TABLET | Freq: Four times a day (QID) | ORAL | Status: DC
  Administered 2024-06-03 – 2024-06-05 (×6): 1000 mg via ORAL
  Filled 2024-06-02 (×11): qty 2

## 2024-06-02 MED ORDER — CHLORHEXIDINE GLUCONATE 0.12 % MT SOLN
15.0000 mL | Freq: Once | OROMUCOSAL | Status: AC
  Administered 2024-06-02: 15 mL via OROMUCOSAL
  Filled 2024-06-02: qty 15

## 2024-06-02 MED ORDER — ALBUMIN HUMAN 5 % IV SOLN
250.0000 mL | INTRAVENOUS | Status: DC | PRN
  Administered 2024-06-02 (×4): 12.5 g via INTRAVENOUS
  Filled 2024-06-02: qty 250

## 2024-06-02 MED ORDER — THROMBIN 20000 UNITS EX SOLR: OROMUCOSAL | Status: DC | PRN

## 2024-06-02 MED ORDER — PROTAMINE SULFATE 10 MG/ML IV SOLN
INTRAVENOUS | Status: DC | PRN
  Administered 2024-06-02: 180 mg via INTRAVENOUS
  Administered 2024-06-02: 20 mg via INTRAVENOUS

## 2024-06-02 MED ORDER — ROCURONIUM BROMIDE 10 MG/ML (PF) SYRINGE
PREFILLED_SYRINGE | INTRAVENOUS | Status: AC
  Filled 2024-06-02: qty 20

## 2024-06-02 MED ORDER — OXYCODONE HCL 5 MG PO TABS
5.0000 mg | ORAL_TABLET | ORAL | Status: DC | PRN
  Administered 2024-06-02: 10 mg via ORAL
  Administered 2024-06-03 (×2): 5 mg via ORAL
  Filled 2024-06-02: qty 1
  Filled 2024-06-02: qty 2
  Filled 2024-06-02: qty 1
  Filled 2024-06-02: qty 2

## 2024-06-02 MED ORDER — SODIUM CHLORIDE 0.45 % IV SOLN: INTRAVENOUS | Status: AC | PRN

## 2024-06-02 MED ORDER — ALBUMIN HUMAN 5 % IV SOLN: INTRAVENOUS | Status: DC | PRN

## 2024-06-02 MED ORDER — CHLORHEXIDINE GLUCONATE 0.12 % MT SOLN: 15.0000 mL | Freq: Once | OROMUCOSAL | Status: DC

## 2024-06-02 NOTE — Anesthesia Procedure Notes (Signed)
 Procedure Name: Intubation Date/Time: 06/02/2024 8:04 AM  Performed by: Vertie Arthea RAMAN, CRNAPre-anesthesia Checklist: Patient identified, Emergency Drugs available, Suction available and Patient being monitored Patient Re-evaluated:Patient Re-evaluated prior to induction Oxygen Delivery Method: Circle System Utilized Preoxygenation: Pre-oxygenation with 100% oxygen Induction Type: IV induction Ventilation: Mask ventilation without difficulty Laryngoscope Size: Mac and 3 Grade View: Grade I Tube type: Oral Tube size: 7.5 mm Number of attempts: 1 Airway Equipment and Method: Stylet and Oral airway Placement Confirmation: ETT inserted through vocal cords under direct vision, positive ETCO2 and breath sounds checked- equal and bilateral Tube secured with: Tape Dental Injury: Teeth and Oropharynx as per pre-operative assessment

## 2024-06-02 NOTE — Anesthesia Procedure Notes (Signed)
 Arterial Line Insertion Start/End1/19/2026 7:00 AM, 06/02/2024 7:05 AM Performed by: CRNA  Patient location: Pre-op. Preanesthetic checklist: patient identified, IV checked, site marked, risks and benefits discussed, surgical consent, monitors and equipment checked, pre-op evaluation, timeout performed and anesthesia consent Lidocaine  1% used for infiltration Left, radial was placed Catheter size: 20 G Hand hygiene performed  and maximum sterile barriers used   Following insertion, dressing applied and Biopatch. Post procedure assessment: normal  Patient tolerated the procedure well with no immediate complications.

## 2024-06-02 NOTE — Discharge Summary (Signed)
 Physician Discharge Summary  Patient ID: Elizabeth Francis MRN: [id number] DOB/AGE: 68-Dec-1957 68 y.o.  Admit date: 06/02/2024 Discharge date: 06/06/2024  Admission Diagnoses:  Coronary artery disease Left main coronary artery stenosis Dyslipidemia Chronic low back pain  Discharge Diagnoses:   Coronary artery disease Left main coronary artery stenosis S/P coronary artery bypass grafting x 2 Dyslipidemia Chronic low back pain    Discharged Condition: stable  PCP is Ina Marcellus RAMAN, MD Referring Provider is Elmira Newman PARAS, MD  History of Present Illness: The patient is a 69 year old woman with a history of hyperlipidemia, GERD, previous smoking until July 2024 with continued vaping, and coronary artery disease who was referred for consideration of CABG.  She was evaluated by cardiology and reported dyspnea on exertion which was felt to be an anginal equivalent and underwent a cardiac CT showing a coronary calcium  score of 523 which was 95th percentile with evidence of greater than 50% left main stenosis.  FFR was consistent with hemodynamically significant left main stenosis.  Cardiac catheterization on 05/06/2024 showed an 80% left main stenosis extending into the bifurcation.  The left circumflex was a large dominant vessel with no disease.  The right was a small nondominant vessel with no disease.  The LAD had mild calcification in the midportion without significant stenosis.  Left ventricular ejection fraction is normal.    She lives with her husband.  She stays relatively active.  Her symptoms mainly occur with significant exertion like pushing her lawnmower or doing heavy work around the house.  She has no symptoms with routine activity or walking on level ground.  Her main symptom has been exertional shortness of breath and tightness in her chest.  I agree that coronary artery bypass graft surgery is the best treatment to resolve her symptoms and prevent massive myocardial  infarction and sudden death. I discussed the operative procedure with the patient and her husband including alternatives, benefits and risks; including but not limited to bleeding, blood transfusion, infection, stroke, myocardial infarction, graft failure, heart block requiring a permanent pacemaker, organ dysfunction, and death. Elizabeth Francis understands and agrees to proceed.    Hospital Course: [name] was admitted for elective surgery on 06/02/24 and taken to the OR where 2-vessel coronary bypass was performed.  Following the procedure she separated from cardiopulmonary bypass without difficulty and was transferred to the ICU intubated and in stable condition.  The patient was extubated the evening of surgery.  She had issues with nausea and vomiting overnight.  She required support with Levophed  and Vasopressin , both of which will be weaned as hemodynamics allow.  She had post operative blood loss anemia with Hgb at 8.0.  She was transfused a unit of packed cells and started on iron supplementation.  She was started on lasix  to facilitate diuresis.  Her swan ganz catheter and arterial line were removed without difficulty.   She was not started on Lovenox  due to thrombocytopenia.  Pacer wires and chest tubes were removed on the second post-op day. Diuresis was initiated with appropriate response. She was ready for transfer to [facility] on post-op day 3 but no beds were available on that unit.  By the next morning, she was down to ner pre-op weight, tolerating a cardiac diet, having appropriate bowel function, and was completely independent with mobility. Her incisions were intact and healing satisfactorily.  She was discharged from the ICU in stable condition on post-op day 4.    Consults: pulmonary/intensive care  Significant  Diagnostic Studies:  PORTABLE CHEST 1 VIEW   COMPARISON:  06/03/2024   FINDINGS: Interval removal of right IJ pulmonary artery catheter with right  IJ sheath still in situ. Stable appearance of multiple thoracic drains. Bibasilar atelectasis with trace bilateral pleural effusions. No discernible pneumothorax. The cardio pericardial silhouette is enlarged. No acute bony abnormality. Telemetry leads overlie the chest.   IMPRESSION: 1. Interval removal of right IJ pulmonary artery catheter. 2. Bibasilar atelectasis with trace bilateral pleural effusions.     Electronically Signed   By: Camellia Candle M.D.   On: 06/04/2024 05:41  Treatments: Surgery  CARDIOVASCULAR SURGERY OPERATIVE NOTE   06/02/2024   Surgeon:  Dorise LOIS Fellers, MD   First Assistant: Laurel Becket,  PA-C:     Preoperative Diagnosis:  Severe left main coronary artery disease   Postoperative Diagnosis:  Same   Procedure:   Median Sternotomy Extracorporeal circulation 3.   Coronary artery bypass grafting x 2   Left internal mammary artery graft to the LAD SVG to OM  4.   Endoscopic vein harvest from the right leg   Anesthesia:  General Endotracheal   Clinical History/Surgical Indication: This 69 year old woman has high-grade left main coronary stenosis with a significant drop in FFR in the LAD to 0.6 approximately to distally and left circumflex territory to 0.61 proximally to distally.  Left ventricular ejection fraction is normal.  I agree that coronary artery bypass graft surgery is the best treatment to resolve her symptoms and prevent massive myocardial infarction and sudden death. I discussed the operative procedure with the patient and her husband including alternatives, benefits and risks; including but not limited to bleeding, blood transfusion, infection, stroke, myocardial infarction, graft failure, heart block requiring a permanent pacemaker, organ dysfunction, and death.  Elizabeth Francis understands and agrees to proceed.   Discharge Exam: Blood pressure (!) 149/82, pulse 86, temperature 97.7 F (36.5 C), temperature source Oral, resp.  rate (!) 22, height 5' 1 (1.549 m), weight 63.1 kg, SpO2 95%.  General appearance: alert and cooperative Neurologic: intact Heart: regular rate and rhythm Lungs: clear to auscultation bilaterally Extremities: no edema Wound: incisions healing well.  Disposition:  Discharged to home in stable condition  Allergies as of 06/06/2024       Reactions   Latex Dermatitis   Meperidine    Other Reaction(s): not sure what the reaction was meperidine   Morphine    Other Reaction(s): Pt thinks this made her loopy and was too strong for her morphine        Medication List     TAKE these medications    acetaminophen  325 MG tablet Commonly known as: TYLENOL  Take 2 tablets (650 mg total) by mouth every 6 (six) hours as needed.   aspirin  EC 81 MG tablet Take 1 tablet (81 mg total) by mouth daily. Swallow whole. What changed: when to take this   atorvastatin  80 MG tablet Commonly known as: LIPITOR Take 1 tablet (80 mg total) by mouth daily. What changed: how much to take   [facility] Cardiac Surgery, Patient & Family Education Misc 1 each by Does not apply route once for 1 dose.   Cortizone-10/Aloe 1 % Generic drug: hydrocortisone cream Apply 1 Application topically daily as needed for itching.   metoprolol  tartrate 25 MG tablet Commonly known as: LOPRESSOR  Take 1 tablet (25 mg total) by mouth 2 (two) times daily. What changed: how much to take   traMADol  50 MG tablet Commonly known as: ULTRAM   Take 1 tablet (50 mg total) by mouth every 6 (six) hours as needed for up to 7 days for moderate pain (pain score 4-6). What changed: reasons to take this   TUMS ULTRA PO Take 1 tablet by mouth daily as needed (heartburn).               Durable Medical Equipment  (From admission, onward)           Start     Ordered   06/04/24 1656  For home use only DME 3 n 1  Once        06/04/24 1659            Follow-up Information     Rutha Manuelita HERO, PA-C. Go on  06/16/2024.   Specialties: Physician Assistant, Thoracic Surgery Why: Your appointment is at 2pm.  Please arrive about 1 hour early for a chest x-ray to be performed on the second floor of the same building. Contact information: [address], [place] [address] [phone]         Liborio Alean SAUNDERS, MD. Go on 07/09/2024.   Specialty: Cardiology Why: Your appointment is at 3pm. Contact information: [address] [place] [address] [phone]         Ina Marcellus RAMAN, MD. Call.   Specialty: Family Medicine Why: Call for appointment with Dr. Ina within 2 weeks of discharge for post hospital follow up. Contact information: [address] [place] [address] [phone]                 The patient has been discharged on:   1.Beta Blocker:  Yes [ x ]                              No   [   ]                              If No, reason:  2.Ace Inhibitor/ARB: Yes [   ]                                     No  [ x ]                                     If No, reason: Titration of beta blocker.   3.Statin:   Yes [ x  ]                  No  [   ]                  If No, reason:  4.Ecasa:  Yes  [ x  ]                  No   [   ]                  If No, reason:  5. ACS on Admission?  No P2Y12 Inhibitor:  Yes  [ x ] Small and severely diseased coronary anatomy                    No  [  ]  Signed: Sonika Levins G. Rai Severns, PA-C 06/06/2024, 7:48 AM

## 2024-06-02 NOTE — Hospital Course (Addendum)
 PCP is Ina Marcellus RAMAN, MD Referring Provider is Elmira Newman PARAS, MD  History of Present Illness: The patient is a 69 year old woman with a history of hyperlipidemia, GERD, previous smoking until July 2024 with continued vaping, and coronary artery disease who was referred for consideration of CABG.  She was evaluated by cardiology and reported dyspnea on exertion which was felt to be an anginal equivalent and underwent a cardiac CT showing a coronary calcium  score of 523 which was 95th percentile with evidence of greater than 50% left main stenosis.  FFR was consistent with hemodynamically significant left main stenosis.  Cardiac catheterization on 05/06/2024 showed an 80% left main stenosis extending into the bifurcation.  The left circumflex was a large dominant vessel with no disease.  The right was a small nondominant vessel with no disease.  The LAD had mild calcification in the midportion without significant stenosis.  Left ventricular ejection fraction is normal.    She lives with her husband.  She stays relatively active.  Her symptoms mainly occur with significant exertion like pushing her lawnmower or doing heavy work around the house.  She has no symptoms with routine activity or walking on level ground.  Her main symptom has been exertional shortness of breath and tightness in her chest.  I agree that coronary artery bypass graft surgery is the best treatment to resolve her symptoms and prevent massive myocardial infarction and sudden death. I discussed the operative procedure with the patient and her husband including alternatives, benefits and risks; including but not limited to bleeding, blood transfusion, infection, stroke, myocardial infarction, graft failure, heart block requiring a permanent pacemaker, organ dysfunction, and death. Monta KATHEE Ditch understands and agrees to proceed.    Hospital Course: Mrs. Garguilo was admitted for elective surgery on 06/02/24 and taken to the OR  where 2-vessel coronary bypass was performed.  Following the procedure she separated from cardiopulmonary bypass without difficulty and was transferred to the ICU intubated and in stable condition.

## 2024-06-02 NOTE — Progress Notes (Signed)
 Called to bedside by RN at shift change. Patient complaining of shortness of breath.  She had no CT output from Left pleural tube over the past hour  POCUS of left with no large effusion - this was looked at by Dr. Olena PCCM  ABG okay  CXR with no large effusion, widening mediastinum, other acute findings  ABG does show Hgb 7.5. will wait for formal CBC to confirm and speak with TCTS if they would like PRBC.   Elizabeth FORBES Furth, PA-C [facility] 06/02/24 8:06 PM  Please see [email or web address] for pager details. From 7A-7P if no response, please call [phone]

## 2024-06-02 NOTE — Op Note (Signed)
 "                                                                                            CARDIOVASCULAR SURGERY OPERATIVE NOTE  06/02/2024  Surgeon:  Dorise LOIS Fellers, MD  First Assistant: Laurel Becket,  PA-C:   An experienced assistant was required given the complexity of this surgery and the standard of surgical care. The assistant was needed for endoscopic vein harvest, exposure, dissection, suctioning, retraction of delicate tissues and sutures, instrument exchange and for overall help during this procedure.   Preoperative Diagnosis:  Severe left main coronary artery disease   Postoperative Diagnosis:  Same   Procedure:  Median Sternotomy Extracorporeal circulation 3.   Coronary artery bypass grafting x 2  Left internal mammary artery graft to the LAD SVG to OM   4.   Endoscopic vein harvest from the right leg   Anesthesia:  General Endotracheal   Clinical History/Surgical Indication:  This 69 year old woman has high-grade left main coronary stenosis with a significant drop in FFR in the LAD to 0.6 approximately to distally and left circumflex territory to 0.61 proximally to distally.  Left ventricular ejection fraction is normal.  I agree that coronary artery bypass graft surgery is the best treatment to resolve her symptoms and prevent massive myocardial infarction and sudden death. I discussed the operative procedure with the patient and her husband including alternatives, benefits and risks; including but not limited to bleeding, blood transfusion, infection, stroke, myocardial infarction, graft failure, heart block requiring a permanent pacemaker, organ dysfunction, and death.  Monta KATHEE Ditch understands and agrees to proceed.     Preparation:  The patient was seen in the preoperative holding area and the correct patient, correct operation were confirmed with the patient after reviewing the medical record and catheterization. The consent was signed by me.  Preoperative antibiotics were given. A pulmonary arterial line and radial arterial line were placed by the anesthesia team. The patient was taken back to the operating room and positioned supine on the operating room table. After being placed under general endotracheal anesthesia by the anesthesia team a foley catheter was placed. The neck, chest, abdomen, and both legs were prepped with betadine soap and solution and draped in the usual sterile manner. A surgical time-out was taken and the correct patient and operative procedure were confirmed with the nursing and anesthesia staff.   Cardiopulmonary Bypass:  A median sternotomy was performed. The pericardium was opened in the midline. Right ventricular function appeared normal. The ascending aorta was of normal size and had no palpable plaque. There were no contraindications to aortic cannulation or cross-clamping. The patient was fully systemically heparinized and the ACT was maintained > 400 sec. The proximal aortic arch was cannulated with a 20 F aortic cannula for arterial inflow. Venous cannulation was performed via the right atrial appendage using a two-staged venous cannula. An antegrade cardioplegia/vent cannula was inserted into the mid-ascending aorta. Aortic occlusion was performed with a single cross-clamp. Systemic cooling to 32 degrees Centigrade and topical cooling of the heart with iced saline were used. Hyperkalemic antegrade cold blood cardioplegia was used  to induce diastolic arrest and was then given at about 20 minute intervals throughout the period of arrest to maintain myocardial temperature at or below 10 degrees centigrade. A temperature probe was inserted into the interventricular septum and an insulating pad was placed in the pericardium.   Left internal mammary artery harvest:  The left side of the sternum was retracted using the Rultract retractor. The left internal mammary artery was harvested as a pedicle graft. All side  branches were clipped. It was a small-sized vessel of good quality with good blood flow. It was ligated distally and divided. It was sprayed with topical papaverine  solution to prevent vasospasm.   Endoscopic vein harvest: Performed by Laurel Becket, PA-C  The right greater saphenous vein was harvested endoscopically through a 2 cm incision medial to the right knee. It was harvested from the upper thigh to below the knee. It was a medium-sized vein of good quality. The side branches were all ligated with 4-0 silk ties.    Coronary arteries:  The coronary arteries were examined. They were all small which made the anastomoses tedious.  LAD:  small with no distal disease LCX:  OM was small but no distal disease. PDA (LCx) was also small but had no disease RCA:  tiny, non-dominant vessel   Grafts: Assisted by Laurel Becket, PA-C.  LIMA to the LAD: 1.5 mm. It was sewn end to side using 8-0 prolene continuous suture. SVG to OM:  1.5 mm. It was sewn end to side using 7-0 prolene continuous suture.  The proximal vein graft anastomosis was performed to the mid-ascending aorta using continuous 6-0 prolene suture. A graft marker was placed around the proximal anastomosis.   Completion:  The patient was rewarmed to 37 degrees Centigrade. The clamp was removed from the LIMA pedicle and there was rapid warming of the septum and return of ventricular fibrillation. The crossclamp was removed with a time of 57 minutes. There was spontaneous return of sinus rhythm. The distal and proximal anastomoses were checked for hemostasis. The position of the grafts was satisfactory. Two temporary epicardial pacing wires were placed on the right atrium and two on the right ventricle. The patient was weaned from CPB without difficulty on no inotropes. CPB time was 74 minutes. Cardiac output was 4 LPM. TEE showed normal LV systolic function.  Heparin  was fully reversed with protamine  and the aortic and venous  cannulas removed. Hemostasis was achieved. Mediastinal and left pleural drainage tubes were placed. The sternum was closed with #6 stainless steel wires. The fascia was closed with continuous # 1 vicryl suture. The subcutaneous tissue was closed with 2-0 vicryl continuous suture. The skin was closed with 3-0 vicryl subcuticular suture. All sponge, needle, and instrument counts were reported correct at the end of the case. Dry sterile dressings were placed over the incisions and around the chest tubes which were connected to pleurevac suction. The patient was then transported to the surgical intensive care unit in stable condition.    "

## 2024-06-02 NOTE — Anesthesia Preprocedure Evaluation (Signed)
"                                    Anesthesia Evaluation   Patient awake    Reviewed: Allergy & Precautions, NPO status , Patient's Chart, lab work & pertinent test results  History of Anesthesia Complications (+) PONV and history of anesthetic complications  Airway Mallampati: II  TM Distance: >3 FB Neck ROM: Full    Dental  (+) Edentulous Upper, Dental Advisory Given   Pulmonary neg shortness of breath, neg sleep apnea, neg COPD, neg recent URI, Patient abstained from smoking., former smoker   breath sounds clear to auscultation       Cardiovascular + angina  + CAD and + Cardiac Stents   Rhythm:Regular  1. Left ventricular ejection fraction, by estimation, is 55 to 60%. The  left ventricle has normal function. The left ventricle has no regional  wall motion abnormalities. Left ventricular diastolic parameters were  normal. The average left ventricular  global longitudinal strain is 19.4 %.   2. Right ventricular systolic function is normal. The right ventricular  size is normal. There is normal pulmonary artery systolic pressure.   3. The mitral valve is grossly normal. Trivial mitral valve  regurgitation. No evidence of mitral stenosis.   4. The aortic valve is grossly normal. Aortic valve regurgitation is not  visualized. No aortic stenosis is present.   5. The inferior vena cava is normal in size with greater than 50%  respiratory variability, suggesting right atrial pressure of 3 mmHg.   Coronary angiography 05/06/2024: LM: Proximal to distal 80% stenosis LAD: Proximal post stenotic dilatation, mild calcification in mid LAD without significant stenosis  Lcx: Large dominant vessel, no significant disease RCA: Small nondominant, no significant disease   LVEDP 18 mmHg     Neuro/Psych  Headaches    GI/Hepatic negative GI ROS, Neg liver ROS,,,  Endo/Other  negative endocrine ROS    Renal/GU negative Renal ROS     Musculoskeletal  (+) Arthritis ,     Abdominal   Peds  Hematology negative hematology ROS (+)   Anesthesia Other Findings   Reproductive/Obstetrics                              Anesthesia Physical Anesthesia Plan  ASA: 4  Anesthesia Plan: General   Post-op Pain Management:    Induction: Intravenous  PONV Risk Score and Plan: 4 or greater and Ondansetron   Airway Management Planned: Oral ETT  Additional Equipment: Arterial line, CVP, PA Cath, TEE and Ultrasound Guidance Line Placement  Intra-op Plan:   Post-operative Plan: Post-operative intubation/ventilation  Informed Consent: I have reviewed the patients History and Physical, chart, labs and discussed the procedure including the risks, benefits and alternatives for the proposed anesthesia with the patient or authorized representative who has indicated his/her understanding and acceptance.     Dental advisory given  Plan Discussed with: CRNA  Anesthesia Plan Comments:          Anesthesia Quick Evaluation  "

## 2024-06-02 NOTE — Transfer of Care (Signed)
 Immediate Anesthesia Transfer of Care Note  Patient: Elizabeth Francis  Procedure(s) Performed: CORONARY ARTERY BYPASS GRAFTING (CABG) TIMES 2 USING LEFT INTERNAL MAMMARY ARTERY AND ENDOSCOPICALLY HARVESTED RIGHT GREATER SAPHENOUS VEIN (Chest) ECHOCARDIOGRAM, TRANSESOPHAGEAL, INTRAOPERATIVE  Patient Location: SICU  Anesthesia Type:General  Level of Consciousness: sedated and unresponsive  Airway & Oxygen Therapy: Patient remains intubated per anesthesia plan and Patient placed on Ventilator (see vital sign flow sheet for setting)  Post-op Assessment: Report given to RN and Post -op Vital signs reviewed and stable  Post vital signs: Reviewed and stable  Last Vitals:  Vitals Value Taken Time  BP 120/60 06/02/24 13:02  Temp    Pulse 85 06/02/24 13:02  Resp 16 06/02/24 13:02  SpO2 100 % 06/02/24 13:02    Last Pain:  Vitals:   06/02/24 0622  TempSrc:   PainSc: 0-No pain         Complications: No notable events documented.

## 2024-06-02 NOTE — Interval H&P Note (Signed)
 History and Physical Interval Note:  06/02/2024 6:33 AM  Elizabeth Francis  has presented today for surgery, with the diagnosis of CAD.  The various methods of treatment have been discussed with the patient and family. After consideration of risks, benefits and other options for treatment, the patient has consented to  Procedures: CORONARY ARTERY BYPASS GRAFTING (CABG) (N/A) ECHOCARDIOGRAM, TRANSESOPHAGEAL, INTRAOPERATIVE (N/A) as a surgical intervention.  The patient's history has been reviewed, patient examined, no change in status, stable for surgery.  I have reviewed the patient's chart and labs.  Questions were answered to the patient's satisfaction.     Sumie Remsen K Deangela Randleman

## 2024-06-02 NOTE — Consult Note (Signed)
 "  NAME:  Elizabeth Francis, MRN:  [id number], DOB:  April 21, 1956, LOS: 0 ADMISSION DATE:  06/02/2024, CONSULTATION DATE:  06/03/23  REFERRING MD:  Lucas  CHIEF COMPLAINT:  CABG x2  History of Present Illness:  Pt is a 69 yr old female with significant hx of CAD, HLD, smoking- until July 2024 but continues to vape, and GERD who presents for CABG x 2 by MD Bartle due to Left Main Coronary Artery Stenosis. PCCM consulted to assist with postop management/care while residing in ICU.    Cell Saver: 250 cc  Total Bypass Time: 74 Cross Clamp: 57  EBL: 150 cc    Pertinent  Medical History   Past Medical History:  Diagnosis Date   Aortic atherosclerosis 03/11/2024   Arthritis    Left Knee   Backache 11/21/2023   Blood clot in eye 1983   Left eye r/t birth control. 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   BMI 26.0-26.9,adult    Cardiac CT  04/17/2024 calcium  score 524, CAD RADS 4 greater than 50% left main stenosis, abnormal CTFFR 04/21/2024   Chronic low back pain 12/12/2023   Chronic thoracic back pain 12/05/2023   Complication of anesthesia    slow to wake up   Dislocation of scapulothoracic joint 12/12/2023   Dyspnea on exertion 03/11/2024   Headache    Migraines occasionally   Hypercholesterolemia    Mitral valve stenosis    Pneumonia    PONV (postoperative nausea and vomiting)    Pure hypercholesterolemia    Scapulalgia 12/05/2023   Scapular dysfunction 12/12/2023     Significant Hospital Events: Including procedures, antibiotic start and stop dates in addition to other pertinent events   1/19 PCCM consult, ICU management for CABG x 2   Interim History / Subjective:  POD O s/p CABG x 2   On low dose neo, insulin  gtt, dex gtt   Labile SBPs- per Anesthesia   Receiving TXA  Minimal chest tube output   Objective    Blood pressure 120/60, pulse 85, temperature 98.1 F (36.7 C), temperature source Oral, resp. rate 16, height 5' 1 (1.549 m), weight 63.4 kg,  SpO2 100%.    Vent Mode: SIMV;PRVC;PSV FiO2 (%):  [50 %] 50 % Set Rate:  [16 bmp] 16 bmp Vt Set:  [380 mL] 380 mL PEEP:  [5 cmH20] 5 cmH20 Pressure Support:  [10 cmH20] 10 cmH20 Plateau Pressure:  [14 cmH20] 14 cmH20   Intake/Output Summary (Last 24 hours) at 06/02/2024 1312 Last data filed at 06/02/2024 1222 Gross per 24 hour  Intake 2480 ml  Output 1550 ml  Net 930 ml   Filed Weights   06/02/24 0602  Weight: 63.4 kg    Examination: General: acute on chronic elderly female, s/p CABG on vent  HENT: Normocephalic, PERRLA intact,  Lungs: clear, diminished  Cardiovascular: s1,s2, RRR, no MRG, sternotomy, chest tubes-serosanguineous  Abdomen: BS active, soft  Extremities: moves all extremities  Neuro: RASS -3, paralyzed  GU: foley- clear, yellow   Resolved problem list   Assessment and Plan  Severe Left CAD s/p CABG x 2 -LIMA to LAD, SVG to OM HLD hx  P: Postoperative management per TCTS Postoperative care per TCTS CT management per protocol Wean pressors for map goal >65 Postoperative ancef /vanc for surgical ppx Continue ASA and statin Wean insulin  gtt per protocol Ensure adequate pain control-tramadol , oxycodone , and morphine   Acute respiratory insufficiency, postop P: Continue on protective ventilation VAP prevention  bundle in place Rapid weaning protocol ordered is in place  Expected perioperative blood loss anemia Expected thrombocytopenia due to CPB P: Obtain post op labs to evaluate H/H    Labs   CBC: Recent Labs  Lab 05/29/24 1147 06/02/24 0815 06/02/24 0943 06/02/24 1018 06/02/24 1032 06/02/24 1045 06/02/24 1133  WBC 8.5  --   --   --   --   --   --   HGB 12.5   < > 9.2* 7.5* 7.1* 7.7* 8.2*  HCT 40.4   < > 27.0* 22.0* 21.0* 24.8* 24.0*  MCV 82.6  --   --   --   --   --   --   PLT 214  --   --   --   --  166  --    < > = values in this interval not displayed.    Basic Metabolic Panel: Recent Labs  Lab 05/29/24 1147 06/02/24 0815  06/02/24 0820 06/02/24 0943 06/02/24 1018 06/02/24 1032 06/02/24 1133  NA 140   < > 140 139 136 136 136  K 4.8   < > 4.1 4.0 4.7 5.7* 5.5*  CL 104  --  105 104  --  102  --   CO2 29  --   --   --   --   --   --   GLUCOSE 109*  --  102* 102*  --  121*  --   BUN 11  --  10 9  --  9  --   CREATININE 0.84  --  0.80 0.70  --  0.70  --   CALCIUM  9.8  --   --   --   --   --   --    < > = values in this interval not displayed.   GFR: Estimated Creatinine Clearance: 58.2 mL/min (by C-G formula based on SCr of 0.7 mg/dL). Recent Labs  Lab 05/29/24 1147  WBC 8.5    Liver Function Tests: Recent Labs  Lab 05/29/24 1147  AST 21  ALT 13  ALKPHOS 117  BILITOT 0.4  PROT 7.5  ALBUMIN  4.2   No results for input(s): LIPASE, AMYLASE in the last 168 hours. No results for input(s): AMMONIA in the last 168 hours.  ABG    Component Value Date/Time   PHART 7.413 06/02/2024 1133   PCO2ART 35.6 06/02/2024 1133   PO2ART 235 (H) 06/02/2024 1133   HCO3 22.8 06/02/2024 1133   TCO2 24 06/02/2024 1133   ACIDBASEDEF 2.0 06/02/2024 1133   O2SAT 100 06/02/2024 1133     Coagulation Profile: Recent Labs  Lab 05/29/24 1147  INR 0.9    Cardiac Enzymes: No results for input(s): CKTOTAL, CKMB, CKMBINDEX, TROPONINI in the last 168 hours.  HbA1C: Hgb A1c MFr Bld  Date/Time Value Ref Range Status  05/29/2024 11:47 AM 6.0 (H) 4.8 - 5.6 % Final    Comment:    (NOTE) Diagnosis of Diabetes The following HbA1c ranges recommended by the American Diabetes Association (ADA) may be used as an aid in the diagnosis of diabetes mellitus.  Hemoglobin             Suggested A1C NGSP%              Diagnosis  <5.7                   Non Diabetic  5.7-6.4  Pre-Diabetic  >6.4                   Diabetic  <7.0                   Glycemic control for                       adults with diabetes.      CBG: No results for input(s): GLUCAP in the last 168 hours.  Review of  Systems:   See HPI   Past Medical History:  She,  has a past medical history of Aortic atherosclerosis (03/11/2024), Arthritis, Backache (11/21/2023), Blood clot in eye (1983), BMI 26.0-26.9,adult, Cardiac CT  04/17/2024 calcium  score 524, CAD RADS 4 greater than 50% left main stenosis, abnormal CTFFR (04/21/2024), Chronic low back pain (12/12/2023), Chronic thoracic back pain (12/05/2023), Complication of anesthesia, Dislocation of scapulothoracic joint (12/12/2023), Dyspnea on exertion (03/11/2024), Headache, Hypercholesterolemia, Mitral valve stenosis, Pneumonia, PONV (postoperative nausea and vomiting), Pure hypercholesterolemia, Scapulalgia (12/05/2023), and Scapular dysfunction (12/12/2023).   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 Her smoking use included cigarettes. She has never used smokeless tobacco. She reports that she does not drink alcohol and does not use drugs.   Family History:  Her family history includes Atrial fibrillation in her mother; Heart attack in her father; Hypertension in her mother and sister.   Allergies Allergies[1]   Home Medications  Prior to Admission medications  Medication Sig Start Date End Date Taking? Authorizing Provider  aspirin  EC 81 MG tablet Take 1 tablet (81 mg total) by mouth daily. Swallow whole. Patient taking differently: Take 81 mg by mouth at bedtime. Swallow whole. 04/21/24  Yes Madireddy, Alean SAUNDERS, MD  atorvastatin  (LIPITOR) 80 MG tablet Take 1 tablet (80 mg total) by mouth  daily. Patient taking differently: Take 40 mg by mouth daily. 04/25/24  Yes Madireddy, Alean SAUNDERS, MD  Calcium  Carbonate Antacid (TUMS ULTRA PO) Take 1 tablet by mouth daily as needed (heartburn).   Yes [provider]  metoprolol  tartrate (LOPRESSOR ) 25 MG tablet Take 0.5 tablets (12.5 mg total) by mouth 2 (two) times daily. 04/25/24  Yes Madireddy, Alean SAUNDERS, MD  traMADol  (ULTRAM ) 50 MG tablet Take 1 tablet (50 mg total) by mouth every 6 (six) hours as needed. 03/26/15  Yes Jarold Olam HERO, PA-C  hydrocortisone cream (CORTIZONE-10/ALOE) 1 % Apply 1 Application topically daily as needed for itching.    [provider]     Critical care time: 55 mins     Christian Kamoni Gentles AGACNP-BC   [facility] 05/29/2024, 8:41 PM  Call Cell: [phone] if have any emergent needs          [1]  Allergies Allergen Reactions   Latex Dermatitis   Meperidine     Other Reaction(s): not sure what the reaction was  meperidine   Morphine     Other Reaction(s): Pt thinks this made  her loopy and was too strong for her  morphine   "

## 2024-06-02 NOTE — Progress Notes (Signed)
" °  TCTS Evening Rounds:   Hemodynamically stable  CI = 2.54  CCM working on extubation. She is an anxious woman.  Urine output good  CT output low  CBC    Component Value Date/Time   WBC 8.5 05/29/2024 1147   RBC 4.89 05/29/2024 1147   HGB 8.8 (L) 06/02/2024 1350   HGB 13.0 04/25/2024 1457   HCT 26.0 (L) 06/02/2024 1350   HCT 40.9 04/25/2024 1457   PLT 166 06/02/2024 1045   PLT 213 04/25/2024 1457   MCV 82.6 05/29/2024 1147   MCV 82 04/25/2024 1457   MCH 25.6 (L) 05/29/2024 1147   MCHC 30.9 05/29/2024 1147   RDW 14.4 05/29/2024 1147   RDW 13.4 04/25/2024 1457     BMET    Component Value Date/Time   NA 141 06/02/2024 1350   NA 139 04/25/2024 1457   K 4.2 06/02/2024 1350   CL 102 06/02/2024 1032   CO2 29 05/29/2024 1147   GLUCOSE 121 (H) 06/02/2024 1032   BUN 9 06/02/2024 1032   BUN 11 04/25/2024 1457   CREATININE 0.70 06/02/2024 1032   CALCIUM  9.8 05/29/2024 1147   EGFR 74 04/25/2024 1457   GFRNONAA >60 05/29/2024 1147     A/P:  Stable postop course. Continue current plans  "

## 2024-06-02 NOTE — Procedures (Signed)
 Extubation Procedure Note  Patient Details:   Name: Elizabeth Francis DOB: December 29, 1955 MRN: [id number]   Airway Documentation:    Vent end date: 06/02/24 Vent end time: 1638   Evaluation  O2 sats: stable throughout Complications: No apparent complications Patient did tolerate procedure well. Bilateral Breath Sounds: Clear, Diminished   Yes  Positive cuff leak noted. Patient placed on [place] 4L with humidity, no stridor noted. Patient had a weak attempt using the incentive spirometer. CCM aware.  Cy GORMAN Pan 06/02/2024, 5:42 PM

## 2024-06-02 NOTE — Brief Op Note (Addendum)
 06/02/2024  7:30 AM  10:59 AM  PATIENT:  Elizabeth Francis  69 y.o. female  PRE-OPERATIVE DIAGNOSIS:  CORONARY ARTERY DISEASE, LEFT MAIN CORONARY ARTERY STENOSIS  POST-OPERATIVE DIAGNOSIS:  CORONARY ARTERY DISEASE, LEFT MAIN CORONARY ARTERY STENOSIS  PROCEDURE:   CORONARY ARTERY BYPASS GRAFTING (CABG) TIMES X 2 USING LEFT INTERNAL MAMMARY ARTERY AND ENDOSCOPICALLY HARVESTED RIGHT GREATER  SAPHENOUS VEIN   LIMA-LAD SVG-OM  ECHOCARDIOGRAM, TRANSESOPHAGEAL, INTRAOPERATIVE  Vein harvest time: Vein prep time:  SURGEON:  Lucas Dorise POUR, MD   PHYSICIAN ASSISTANT: Chyna Kneece  ASSISTANTS: Everhart, Veda FERNS, RN, Scrub Person  Dyann Iha, RN, Scrub Person  Bonnie Honore BRAVO, RN, RN First Assistant   ANESTHESIA:   general  EBL: ( CellSaver blood returned to patient)  BLOOD ADMINISTERED:none  DRAINS: Left pleural and mediastinal drains   LOCAL MEDICATIONS USED:  NONE  COUNTS:  Correct  DICTATION: .Dragon Dictation  PLAN OF CARE: Admit to inpatient   PATIENT DISPOSITION:  ICU - intubated and hemodynamically stable.   Delay start of Pharmacological VTE agent (>24hrs) due to surgical blood loss or risk of bleeding: yes

## 2024-06-02 NOTE — Discharge Instructions (Signed)
 Discharge Instructions:  1. You may shower, please wash incisions daily with soap and water  and keep dry.  If you wish to cover wounds with dressing you may do so but please keep clean and change daily.  No tub baths or swimming until incisions have completely healed.  If your incisions become red or develop any drainage please call our office at [phone]  2. No Driving until cleared by Dr. Jeralyn office and you are no longer using narcotic pain medications  3. Monitor your weight daily.. Please use the same scale and weigh at same time... If you gain 5-10 lbs in 48 hours with associated lower extremity swelling, please contact our office at [phone]  4. Fever of 101.5 for at least 24 hours with no source, please contact our office at [phone]  5. Activity- up as tolerated, please walk at least 3 times per day.  Avoid strenuous activity, no lifting, pushing, or pulling with your arms over 8-10 lbs for a minimum of 6 weeks  6. If any questions or concerns arise, please do not hesitate to contact our office at [phone]  7. Please contact your primary care provider, Dr. Marcellus Baptist [phone]), for a post-hospital follow up appointment within 2 weeks of discharge.

## 2024-06-03 ENCOUNTER — Inpatient Hospital Stay (HOSPITAL_COMMUNITY)

## 2024-06-03 ENCOUNTER — Encounter (HOSPITAL_COMMUNITY): Payer: Self-pay | Admitting: Surgery

## 2024-06-03 DIAGNOSIS — J9589 Other postprocedural complications and disorders of respiratory system, not elsewhere classified: Secondary | ICD-10-CM | POA: Diagnosis not present

## 2024-06-03 DIAGNOSIS — I2510 Atherosclerotic heart disease of native coronary artery without angina pectoris: Secondary | ICD-10-CM | POA: Diagnosis not present

## 2024-06-03 DIAGNOSIS — E785 Hyperlipidemia, unspecified: Secondary | ICD-10-CM | POA: Diagnosis not present

## 2024-06-03 DIAGNOSIS — R0689 Other abnormalities of breathing: Secondary | ICD-10-CM | POA: Diagnosis not present

## 2024-06-03 LAB — CBC
HCT: 24.6 % — ABNORMAL LOW (ref 36.0–46.0)
Hemoglobin: 8 g/dL — ABNORMAL LOW (ref 12.0–15.0)
MCH: 26.8 pg (ref 26.0–34.0)
MCHC: 32.5 g/dL (ref 30.0–36.0)
MCV: 82.6 fL (ref 80.0–100.0)
Platelets: 80 K/uL — ABNORMAL LOW (ref 150–400)
RBC: 2.98 MIL/uL — ABNORMAL LOW (ref 3.87–5.11)
RDW: 14.2 % (ref 11.5–15.5)
WBC: 7.4 K/uL (ref 4.0–10.5)
nRBC: 0 % (ref 0.0–0.2)

## 2024-06-03 LAB — BASIC METABOLIC PANEL WITH GFR
Anion gap: 7 (ref 5–15)
Anion gap: 8 (ref 5–15)
BUN: 11 mg/dL (ref 8–23)
BUN: 13 mg/dL (ref 8–23)
CO2: 25 mmol/L (ref 22–32)
CO2: 27 mmol/L (ref 22–32)
Calcium: 7.9 mg/dL — ABNORMAL LOW (ref 8.9–10.3)
Calcium: 8.3 mg/dL — ABNORMAL LOW (ref 8.9–10.3)
Chloride: 106 mmol/L (ref 98–111)
Chloride: 103 mmol/L (ref 98–111)
Creatinine, Ser: 0.7 mg/dL (ref 0.44–1.00)
Creatinine, Ser: 0.9 mg/dL (ref 0.44–1.00)
GFR, Estimated: >60 mL/min
GFR, Estimated: >60 mL/min
Glucose, Bld: 138 mg/dL — ABNORMAL HIGH (ref 70–99)
Glucose, Bld: 117 mg/dL — ABNORMAL HIGH (ref 70–99)
Potassium: 4.4 mmol/L (ref 3.5–5.1)
Potassium: 3.8 mmol/L (ref 3.5–5.1)
Sodium: 138 mmol/L (ref 135–145)
Sodium: 138 mmol/L (ref 135–145)

## 2024-06-03 LAB — LIPID PANEL
Cholesterol: 51 mg/dL (ref 0–200)
HDL: 25 mg/dL — ABNORMAL LOW
LDL Cholesterol: 14 mg/dL (ref 0–99)
Total CHOL/HDL Ratio: 2 ratio
Triglycerides: 59 mg/dL
VLDL: 12 mg/dL (ref 0–40)

## 2024-06-03 LAB — GLUCOSE, CAPILLARY
Glucose-Capillary: 103 mg/dL — ABNORMAL HIGH (ref 70–99)
Glucose-Capillary: 108 mg/dL — ABNORMAL HIGH (ref 70–99)
Glucose-Capillary: 110 mg/dL — ABNORMAL HIGH (ref 70–99)
Glucose-Capillary: 112 mg/dL — ABNORMAL HIGH (ref 70–99)
Glucose-Capillary: 120 mg/dL — ABNORMAL HIGH (ref 70–99)
Glucose-Capillary: 123 mg/dL — ABNORMAL HIGH (ref 70–99)
Glucose-Capillary: 123 mg/dL — ABNORMAL HIGH (ref 70–99)
Glucose-Capillary: 134 mg/dL — ABNORMAL HIGH (ref 70–99)
Glucose-Capillary: 134 mg/dL — ABNORMAL HIGH (ref 70–99)
Glucose-Capillary: 135 mg/dL — ABNORMAL HIGH (ref 70–99)
Glucose-Capillary: 142 mg/dL — ABNORMAL HIGH (ref 70–99)
Glucose-Capillary: 143 mg/dL — ABNORMAL HIGH (ref 70–99)
Glucose-Capillary: 96 mg/dL (ref 70–99)

## 2024-06-03 LAB — MAGNESIUM
Magnesium: 2.9 mg/dL — ABNORMAL HIGH (ref 1.7–2.4)
Magnesium: 2.6 mg/dL — ABNORMAL HIGH (ref 1.7–2.4)

## 2024-06-03 LAB — PREPARE RBC (CROSSMATCH)

## 2024-06-03 MED ORDER — SODIUM CHLORIDE 0.9% IV SOLUTION: Freq: Once | INTRAVENOUS | Status: AC

## 2024-06-03 MED ORDER — FE FUM-VIT C-VIT B12-FA 460-60-0.01-1 MG PO CAPS
1.0000 | ORAL_CAPSULE | Freq: Every day | ORAL | Status: DC
  Administered 2024-06-04 – 2024-06-06 (×3): 1 via ORAL
  Filled 2024-06-03 (×4): qty 1

## 2024-06-03 MED ORDER — ASPIRIN 81 MG PO CHEW: 81.0000 mg | CHEWABLE_TABLET | Freq: Every day | ORAL | Status: DC

## 2024-06-03 MED ORDER — FUROSEMIDE 10 MG/ML IJ SOLN
40.0000 mg | Freq: Once | INTRAMUSCULAR | Status: AC
  Administered 2024-06-03: 40 mg via INTRAVENOUS
  Filled 2024-06-03: qty 4

## 2024-06-03 MED ORDER — INSULIN ASPART 100 UNIT/ML IJ SOLN: 0.0000 [IU] | INTRAMUSCULAR | Status: DC

## 2024-06-03 MED ORDER — ORAL CARE MOUTH RINSE: 15.0000 mL | OROMUCOSAL | Status: DC | PRN

## 2024-06-03 MED ORDER — INSULIN ASPART 100 UNIT/ML IJ SOLN
0.0000 [IU] | INTRAMUSCULAR | Status: DC
  Administered 2024-06-03: 2 [IU] via SUBCUTANEOUS
  Filled 2024-06-03: qty 2

## 2024-06-03 MED ORDER — PROMETHAZINE (PHENERGAN) 6.25MG IN NS 50ML IVPB
6.2500 mg | Freq: Four times a day (QID) | INTRAVENOUS | Status: DC | PRN
  Administered 2024-06-03: 6.25 mg via INTRAVENOUS
  Filled 2024-06-03: qty 50
  Filled 2024-06-03: qty 0.25

## 2024-06-03 MED ORDER — ASPIRIN 81 MG PO TBEC
81.0000 mg | DELAYED_RELEASE_TABLET | Freq: Every day | ORAL | Status: DC
  Administered 2024-06-04 – 2024-06-05 (×2): 81 mg via ORAL
  Filled 2024-06-03 (×3): qty 1

## 2024-06-03 MED ORDER — POTASSIUM CHLORIDE 10 MEQ/50ML IV SOLN
10.0000 meq | INTRAVENOUS | Status: AC
  Administered 2024-06-03 (×4): 10 meq via INTRAVENOUS
  Filled 2024-06-03 (×4): qty 50

## 2024-06-03 MED FILL — Thrombin (Recombinant) For Soln 20000 Unit: CUTANEOUS | Qty: 1 | Status: AC

## 2024-06-03 NOTE — Progress Notes (Signed)
 1 Day Post-Op Procedures (LRB): CORONARY ARTERY BYPASS GRAFTING (CABG) TIMES 2 USING LEFT INTERNAL MAMMARY ARTERY AND ENDOSCOPICALLY HARVESTED RIGHT GREATER SAPHENOUS VEIN (N/A) ECHOCARDIOGRAM, TRANSESOPHAGEAL, INTRAOPERATIVE (N/A) Subjective:  Nausea and vomiting overnight. Some pain. Has been getting tramadol .  Objective: Vital signs in last 24 hours: Temp:  [95.4 F (35.2 C)-98.1 F (36.7 C)] 97.7 F (36.5 C) (01/20 0645) Pulse Rate:  [61-85] 80 (01/20 0645) Cardiac Rhythm: Atrial paced (01/20 0400) Resp:  [0-28] 28 (01/20 0645) BP: (76-120)/(49-70) 95/53 (01/20 0600) SpO2:  [89 %-100 %] 95 % (01/20 0645) Arterial Line BP: (62-164)/(31-78) 101/46 (01/20 0645) FiO2 (%):  [40 %-50 %] 40 % (01/19 1630) Weight:  [68.2 kg] 68.2 kg (01/20 0500)  Hemodynamic parameters for last 24 hours: PAP: (18-32)/(5-19) 22/10 CVP:  [2 mmHg-17 mmHg] 7 mmHg PCWP:  [8 mmHg-13 mmHg] 13 mmHg CO:  [3.2 L/min-4.9 L/min] 4.5 L/min CI:  [1.97 L/min/m2-3.03 L/min/m2] 2.8 L/min/m2  Intake/Output from previous day: 01/19 0701 - 01/20 0700 In: 5310.9 [I.V.:2817.9; Blood:593.3; IV Piggyback:1899.6] Out: 3822 [Urine:2792; Emesis/NG output:200; Blood:400; Chest Tube:430] Intake/Output this shift: Total I/O In: 884 [I.V.:365.6; IV Piggyback:518.4] Out: 812 [Urine:452; Emesis/NG output:200; Chest Tube:160]  General appearance: alert Neurologic: intact Heart: regular rate and rhythm Lungs: clear to auscultation bilaterally Abdomen: soft, non-tender; bowel sounds hypoactive. Extremities: edema mild Wound: dressings dry  Lab Results: Recent Labs    06/02/24 2056 06/03/24 0405  WBC 9.9 7.4  HGB 8.2* 8.0*  HCT 25.3* 24.6*  PLT 87* 80*   BMET:  Recent Labs    06/02/24 1729 06/02/24 1940 06/03/24 0405  NA 143 141 138  K 4.7 4.3 4.4  CL 108  --  106  CO2 27  --  25  GLUCOSE 149*  --  138*  BUN 9  --  11  CREATININE 0.75  --  0.70  CALCIUM  7.6*  --  7.9*    PT/INR:  Recent Labs     06/02/24 1317  LABPROT 17.1*  INR 1.3*   ABG    Component Value Date/Time   PHART 7.330 (L) 06/02/2024 1940   HCO3 25.1 06/02/2024 1940   TCO2 27 06/02/2024 1940   ACIDBASEDEF 1.0 06/02/2024 1940   O2SAT 98 06/02/2024 1940   CBG (last 3)  Recent Labs    06/03/24 0205 06/03/24 0316 06/03/24 0458  GLUCAP 120* 110* 123*   CXR: ok  ECG pending Assessment/Plan: S/P Procedures (LRB): CORONARY ARTERY BYPASS GRAFTING (CABG) TIMES 2 USING LEFT INTERNAL MAMMARY ARTERY AND ENDOSCOPICALLY HARVESTED RIGHT GREATER SAPHENOUS VEIN (N/A) ECHOCARDIOGRAM, TRANSESOPHAGEAL, INTRAOPERATIVE (N/A)  POD 1  Hemodynamics fairly stable. Off NE, still on vaso 0.03. Rhythm is sinus 65 under pacer. AAI pacing at 80 to support BP.  Wean vaso as tolerated.  Expected acute postop blood loss anemia with low BSA. Hgb is 8.0 this am and still on vasopressor so will transfuse 1 unit and start iron.   Wt is 10 lbs over preop. Start diuresis after transfusion.  Glucose under adequate control. Preop Hgb A1c was 6.0 on no meds. Transition to SSI.  N/V: continue Reglan , Zofran  and add prn phenergan  for refractory N/V.  Keep pacer wires and CT's for now. May be able to remove chest tubes later today.  DC swan, arterial line today.  IS, OOB.  Thrombocytopenia: No lovenox  for now.   LOS: 1 day    Dorise MARLA Fellers 06/03/2024

## 2024-06-03 NOTE — Anesthesia Postprocedure Evaluation (Signed)
"   Anesthesia Post Note  Patient: Elizabeth Francis  Procedure(s) Performed: CORONARY ARTERY BYPASS GRAFTING (CABG) TIMES 2 USING LEFT INTERNAL MAMMARY ARTERY AND ENDOSCOPICALLY HARVESTED RIGHT GREATER SAPHENOUS VEIN (Chest) ECHOCARDIOGRAM, TRANSESOPHAGEAL, INTRAOPERATIVE     Patient location during evaluation: SICU Anesthesia Type: General Level of consciousness: sedated Pain management: pain level controlled Vital Signs Assessment: post-procedure vital signs reviewed and stable Respiratory status: patient remains intubated per anesthesia plan Cardiovascular status: stable Postop Assessment: no apparent nausea or vomiting Anesthetic complications: no   No notable events documented.               Rettie Laird      "

## 2024-06-03 NOTE — Consult Note (Signed)
 "  NAME:  Elizabeth Francis, MRN:  [id number], DOB:  1955-08-19, LOS: 1 ADMISSION DATE:  06/02/2024, CONSULTATION DATE:  06/03/23  REFERRING MD:  Lucas  CHIEF COMPLAINT:  CABG x2  History of Present Illness:  Pt is a 69 yr old female with significant hx of CAD, HLD, smoking- until July 2024 but continues to vape, and GERD who presents for CABG x 2 by MD Bartle due to Left Main Coronary Artery Stenosis. PCCM consulted to assist with postop management/care while residing in ICU.    Cell Saver: 250 cc  Total Bypass Time: 74 Cross Clamp: 57  EBL: 150 cc    Pertinent  Medical History   Past Medical History:  Diagnosis Date   Aortic atherosclerosis 03/11/2024   Arthritis    Left Knee   Backache 11/21/2023   Blood clot in eye 1983   Left eye r/t birth control. 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   BMI 26.0-26.9,adult    Cardiac CT  04/17/2024 calcium  score 524, CAD RADS 4 greater than 50% left main stenosis, abnormal CTFFR 04/21/2024   Chronic low back pain 12/12/2023   Chronic thoracic back pain 12/05/2023   Complication of anesthesia    slow to wake up   Dislocation of scapulothoracic joint 12/12/2023   Dyspnea on exertion 03/11/2024   Headache    Migraines occasionally   Hypercholesterolemia    Mitral valve stenosis    Pneumonia    PONV (postoperative nausea and vomiting)    Pure hypercholesterolemia    Scapulalgia 12/05/2023   Scapular dysfunction 12/12/2023     Significant Hospital Events: Including procedures, antibiotic start and stop dates in addition to other pertinent events   1/19 PCCM consult, ICU management for CABG x 2  1/20 Improving, weaning off pressors, still remains on vaso, received 1 unit of PRBCs yesterday, plan for another unit for diuresis   Interim History / Subjective:  POD x 1 s/p CABG  No significant events overnight  Following commands, improving  Objective    Blood pressure (!) 94/52, pulse 80, temperature 98.8 F (37.1 C),  resp. rate 20, height 5' 1 (1.549 m), weight 68.2 kg, SpO2 91%. PAP: (18-32)/(5-19) 28/13 CVP:  [2 mmHg-21 mmHg] 14 mmHg PCWP:  [8 mmHg-13 mmHg] 13 mmHg CO:  [3.2 L/min-4.9 L/min] 4.5 L/min CI:  [1.97 L/min/m2-3.03 L/min/m2] 2.8 L/min/m2  Vent Mode: PSV;CPAP FiO2 (%):  [40 %-50 %] 40 % Set Rate:  [16 bmp] 16 bmp Vt Set:  [380 mL] 380 mL PEEP:  [5 cmH20] 5 cmH20 Pressure Support:  [8 cmH20-10 cmH20] 8 cmH20 Plateau Pressure:  [14 cmH20] 14 cmH20   Intake/Output Summary (Last 24 hours) at 06/03/2024 1214 Last data filed at 06/03/2024 1120 Gross per 24 hour  Intake 3443.91 ml  Output 3012 ml  Net 431.91 ml   Filed Weights   06/02/24 0602 06/03/24 0500  Weight: 63.4 kg 68.2 kg    Examination: General: Acute on chronic older adult female sitting up in ICU bed NAD HEENT: Normocephalic, PERRL intact, missing teeth, poor dentition, pink MM Cardiac: S1, S2, PPM-80, no MRG, no JVD, chest tubes in place-mediastinal and pleural, sternotomy dressing in place clean dry and intact, diminished chest tube output Pulmonary: 2 L nasal cannula, clear diminished, GI: Bowel sounds active, soft Skin: No rash Extremities: Moves all extremities, nonpitting lower extremity edema Neuro: Follows all commands, oriented GU: Foley in place  Resolved problem list   Assessment and Plan  Severe Left CAD s/p CABG x 2 -LIMA to LAD, SVG to OM HLD hx  -during postop care-patient hypovolemic versus vasoplegic needing Levophed  and vaso, 1 unit of PRBCs, now improving P: Continue postoperative management per TCTS Continue to wean vasopressin  as tolerated Will give an additional unit of PRBCs, titrate off the vaso plan for Lasix  40 mg IV later today as well If output of chest tube is minimal with mobilization, potential will remove chest tubes later today Keep pacing wires in Continue postoperative surgical antibiotics prophylaxis Continue ASA and statin Transition off insulin  Continue to ensure adequate  pain control with multi modal regimen  Acute respiratory insufficiency, postop-improving On 1 to 2 L nasal cannula P: Continue to wean O2 as tolerated, sat goal greater than 92% Mobilize out of bed IS, flutter valve   Expected perioperative blood loss anemia Expected thrombocytopenia due to CPB Hgb stable, has not dropped <8 on CBC per lab, but patient received 1 unit of PRBC's yesterday P: Chest tube output-minimal, give additional PRBC per TCTs  Continue to monitor for signs of bleeding  Continue to trend h/h per CBC    Labs   CBC: Recent Labs  Lab 05/29/24 1147 06/02/24 0815 06/02/24 1045 06/02/24 1133 06/02/24 1728 06/02/24 1729 06/02/24 1940 06/02/24 2056 06/03/24 0405  WBC 8.5  --   --   --   --  11.7*  --  9.9 7.4  HGB 12.5   < > 7.7*   < > 8.5* 8.7* 7.5* 8.2* 8.0*  HCT 40.4   < > 24.8*   < > 25.0* 27.3* 22.0* 25.3* 24.6*  MCV 82.6  --   --   --   --  83.2  --  83.2 82.6  PLT 214  --  166  --   --  114*  --  87* 80*   < > = values in this interval not displayed.    Basic Metabolic Panel: Recent Labs  Lab 05/29/24 1147 06/02/24 0815 06/02/24 0820 06/02/24 0943 06/02/24 1018 06/02/24 1032 06/02/24 1133 06/02/24 1350 06/02/24 1728 06/02/24 1729 06/02/24 1940 06/03/24 0405  NA 140   < > 140 139   < > 136   < > 141 142 143 141 138  K 4.8   < > 4.1 4.0   < > 5.7*   < > 4.2 4.5 4.7 4.3 4.4  CL 104  --  105 104  --  102  --   --   --  108  --  106  CO2 29  --   --   --   --   --   --   --   --  27  --  25  GLUCOSE 109*  --  102* 102*  --  121*  --   --   --  149*  --  138*  BUN 11  --  10 9  --  9  --   --   --  9  --  11  CREATININE 0.84  --  0.80 0.70  --  0.70  --   --   --  0.75  --  0.70  CALCIUM  9.8  --   --   --   --   --   --   --   --  7.6*  --  7.9*  MG  --   --   --   --   --   --   --   --   --  3.5*  --  2.9*   < > = values in this interval not displayed.   GFR: Estimated Creatinine Clearance: 60.3 mL/min (by C-G formula based on SCr of  0.7 mg/dL). Recent Labs  Lab 05/29/24 1147 06/02/24 1729 06/02/24 2056 06/03/24 0405  WBC 8.5 11.7* 9.9 7.4    Liver Function Tests: Recent Labs  Lab 05/29/24 1147  AST 21  ALT 13  ALKPHOS 117  BILITOT 0.4  PROT 7.5  ALBUMIN  4.2   No results for input(s): LIPASE, AMYLASE in the last 168 hours. No results for input(s): AMMONIA in the last 168 hours.  Home Medications  Prior to Admission medications  Medication Sig Start Date End Date Taking? Authorizing Provider  aspirin  EC 81 MG tablet Take 1 tablet (81 mg total) by mouth daily. Swallow whole. Patient taking differently: Take 81 mg by mouth at bedtime. Swallow whole. 04/21/24  Yes Madireddy, Alean SAUNDERS, MD  atorvastatin  (LIPITOR) 80 MG tablet Take 1 tablet (80 mg total) by mouth daily. Patient taking differently: Take 40 mg by mouth daily. 04/25/24  Yes Madireddy, Alean SAUNDERS, MD  Calcium  Carbonate Antacid (TUMS ULTRA PO) Take 1 tablet by mouth daily as needed (heartburn).   Yes [provider]  metoprolol  tartrate (LOPRESSOR ) 25 MG tablet Take 0.5 tablets (12.5 mg total) by mouth 2 (two) times daily. 04/25/24  Yes Madireddy, Alean SAUNDERS, MD  traMADol  (ULTRAM ) 50 MG tablet Take 1 tablet (50 mg total) by mouth every 6 (six) hours as needed. 03/26/15  Yes Jarold Olam HERO, PA-C  hydrocortisone cream (CORTIZONE-10/ALOE) 1 % Apply 1 Application topically daily as needed for itching.    [provider]     Critical care time: 45 mins     [facility] 05/29/2024, 8:41 PM  Call Cell: [phone] if have any emergent needs        "

## 2024-06-03 NOTE — Progress Notes (Signed)
 PT Cancellation Note  Patient Details Name: Elizabeth Francis MRN: [id number] DOB: 03-Jul-1955   Cancelled Treatment:    Reason Eval/Treat Not Completed: Patient declined, no reason specified, pt in chair upon my arrival, but reports too nauseous to attempt further mobility at this time. Will continue to follow and evaluate as able.   Izetta Call, PT, DPT   Acute Rehabilitation Department Office [phone] Secure Chat Communication Preferred   Izetta JULIANNA Call 06/03/2024, 2:40 PM

## 2024-06-03 NOTE — Addendum Note (Signed)
 Addendum  created 06/03/24 1538 by Leopoldo Bruckner, MD   Child order released for a procedure order, Clinical Note Signed, Intraprocedure Blocks edited, LDA created via procedure documentation, SmartForm saved

## 2024-06-03 NOTE — Anesthesia Procedure Notes (Signed)
 Central Venous Catheter Insertion Performed by: Leopoldo Bruckner, MD, anesthesiologist Start/End1/19/2026 7:01 AM, 06/02/2024 7:17 AM Patient location: Pre-op. Preanesthetic checklist: patient identified, IV checked, site marked, risks and benefits discussed, surgical consent, monitors and equipment checked, pre-op evaluation, timeout performed and anesthesia consent Lidocaine  1% used for infiltration and patient sedated Hand hygiene performed  and maximum sterile barriers used  Catheter size: 8.5 Fr Sheath introducer Procedure performed using ultrasound to evaluate access site. Ultrasound Notes:relevant anatomy identified, ultrasound used to visualize needle entry, vessel patent under ultrasound and image(s) printed for medical record. Attempts: 1 Following insertion, line sutured and dressing applied. Post procedure assessment: blood return through all ports, free fluid flow and no air  Patient tolerated the procedure well with no immediate complications.

## 2024-06-03 NOTE — Anesthesia Procedure Notes (Signed)
 Central Venous Catheter Insertion Performed by: Leopoldo Bruckner, MD, anesthesiologist Start/End1/19/2026 7:01 AM, 06/02/2024 7:17 AM Patient location: Pre-op. Preanesthetic checklist: patient identified, IV checked, site marked, risks and benefits discussed, surgical consent, monitors and equipment checked, pre-op evaluation, timeout performed and anesthesia consent Hand hygiene performed  and maximum sterile barriers used  PA cath was placed.Swan type:thermodilution PA Cath depth:40 Attempts: 1 Patient tolerated the procedure well with no immediate complications.

## 2024-06-03 NOTE — Progress Notes (Signed)
" ° °  [address] [place] [address]             [phone]   POD # 1 CABG  BP (!) 112/56 (BP Location: Right Arm)   Pulse 80   Temp 98.8 F (37.1 C)   Resp 20   Ht 5' 1 (1.549 m)   Wt 68.2 kg   SpO2 96%   BMI 28.41 kg/m  1L [place] 97% sat  Intake/Output Summary (Last 24 hours) at 06/03/2024 1838 Last data filed at 06/03/2024 1700 Gross per 24 hour  Intake 1828.85 ml  Output 1867 ml  Net -38.15 ml   K 3.8, creatinine 0.9  Elspeth C. Kerrin, MD [facility] [phone]  "

## 2024-06-04 ENCOUNTER — Inpatient Hospital Stay (HOSPITAL_COMMUNITY)

## 2024-06-04 DIAGNOSIS — Z951 Presence of aortocoronary bypass graft: Secondary | ICD-10-CM | POA: Diagnosis not present

## 2024-06-04 DIAGNOSIS — J9811 Atelectasis: Secondary | ICD-10-CM | POA: Diagnosis not present

## 2024-06-04 LAB — CBC
HCT: 28 % — ABNORMAL LOW (ref 36.0–46.0)
Hemoglobin: 9.3 g/dL — ABNORMAL LOW (ref 12.0–15.0)
MCH: 27.5 pg (ref 26.0–34.0)
MCHC: 33.2 g/dL (ref 30.0–36.0)
MCV: 82.8 fL (ref 80.0–100.0)
Platelets: 94 K/uL — ABNORMAL LOW (ref 150–400)
RBC: 3.38 MIL/uL — ABNORMAL LOW (ref 3.87–5.11)
RDW: 15.2 % (ref 11.5–15.5)
WBC: 12.9 K/uL — ABNORMAL HIGH (ref 4.0–10.5)
nRBC: 0 % (ref 0.0–0.2)

## 2024-06-04 LAB — BASIC METABOLIC PANEL WITH GFR
Anion gap: 7 (ref 5–15)
BUN: 16 mg/dL (ref 8–23)
CO2: 29 mmol/L (ref 22–32)
Calcium: 8.6 mg/dL — ABNORMAL LOW (ref 8.9–10.3)
Chloride: 102 mmol/L (ref 98–111)
Creatinine, Ser: 0.92 mg/dL (ref 0.44–1.00)
GFR, Estimated: >60 mL/min
Glucose, Bld: 102 mg/dL — ABNORMAL HIGH (ref 70–99)
Potassium: 4.1 mmol/L (ref 3.5–5.1)
Sodium: 137 mmol/L (ref 135–145)

## 2024-06-04 LAB — GLUCOSE, CAPILLARY
Glucose-Capillary: 97 mg/dL (ref 70–99)
Glucose-Capillary: 98 mg/dL (ref 70–99)
Glucose-Capillary: 98 mg/dL (ref 70–99)
Glucose-Capillary: 95 mg/dL (ref 70–99)

## 2024-06-04 MED ORDER — POTASSIUM CHLORIDE 10 MEQ/50ML IV SOLN
10.0000 meq | INTRAVENOUS | Status: AC
  Administered 2024-06-04 (×3): 10 meq via INTRAVENOUS
  Filled 2024-06-04: qty 50

## 2024-06-04 MED ORDER — FUROSEMIDE 10 MG/ML IJ SOLN
40.0000 mg | Freq: Once | INTRAMUSCULAR | Status: AC
  Administered 2024-06-04: 40 mg via INTRAVENOUS
  Filled 2024-06-04: qty 4

## 2024-06-04 MED FILL — Electrolyte-R (PH 7.4) Solution: INTRAVENOUS | Qty: 3000 | Status: AC

## 2024-06-04 MED FILL — Heparin Sodium (Porcine) Inj 1000 Unit/ML: INTRAMUSCULAR | Qty: 10 | Status: AC

## 2024-06-04 MED FILL — Heparin Sodium (Porcine) Inj 1000 Unit/ML: INTRAMUSCULAR | Qty: 30 | Status: AC

## 2024-06-04 MED FILL — Potassium Chloride Inj 2 mEq/ML: INTRAVENOUS | Qty: 40 | Status: AC

## 2024-06-04 MED FILL — Mannitol IV Soln 20%: INTRAVENOUS | Qty: 500 | Status: AC

## 2024-06-04 MED FILL — Magnesium Sulfate Inj 50%: INTRAMUSCULAR | Qty: 10 | Status: AC

## 2024-06-04 MED FILL — Lidocaine HCl Local Soln Prefilled Syringe 100 MG/5ML (2%): INTRAMUSCULAR | Qty: 5 | Status: AC

## 2024-06-04 MED FILL — henylephrine-NaCl Pref Syr 0.8 MG/10ML-0.9% (80 MCG/ML): INTRAVENOUS | Qty: 30 | Status: AC

## 2024-06-04 MED FILL — Heparin Sodium (Porcine) Inj 1000 Unit/ML: Qty: 1000 | Status: AC

## 2024-06-04 MED FILL — Sodium Chloride IV Soln 0.9%: INTRAVENOUS | Qty: 2000 | Status: AC

## 2024-06-04 MED FILL — Albumin, Human Inj 5%: INTRAVENOUS | Qty: 250 | Status: AC

## 2024-06-04 NOTE — Progress Notes (Signed)
 2 Days Post-Op Procedures (LRB): CORONARY ARTERY BYPASS GRAFTING (CABG) TIMES 2 USING LEFT INTERNAL MAMMARY ARTERY AND ENDOSCOPICALLY HARVESTED RIGHT GREATER SAPHENOUS VEIN (N/A) ECHOCARDIOGRAM, TRANSESOPHAGEAL, INTRAOPERATIVE (N/A) Subjective: Still has some nausea, but better.  Objective: Vital signs in last 24 hours: Temp:  [97.6 F (36.4 C)-99 F (37.2 C)] 98 F (36.7 C) (01/21 0354) Pulse Rate:  [78-88] 88 (01/21 0700) Cardiac Rhythm: Atrial paced (01/21 0429) Resp:  [10-23] 17 (01/21 0700) BP: (88-127)/(50-73) 127/62 (01/21 0700) SpO2:  [88 %-98 %] 92 % (01/21 0700) Arterial Line BP: (49-125)/(42-51) 103/49 (01/20 1015) Weight:  [65 kg] 65 kg (01/21 0500)  Hemodynamic parameters for last 24 hours: PAP: (26-31)/(11-15) 28/13 CVP:  [9 mmHg-21 mmHg] 14 mmHg  Intake/Output from previous day: 01/20 0701 - 01/21 0700 In: 929.4 [I.V.:146.1; Blood:315; IV Piggyback:468.2] Out: 2265 [Urine:1925; Chest Tube:340] Intake/Output this shift: No intake/output data recorded.  General appearance: alert and cooperative Neurologic: intact Heart: regular rate and rhythm Lungs: clear to auscultation bilaterally Abdomen: soft, non-tender; bowel sounds normal Extremities: edema mild Wound: dressings dry  Lab Results: Recent Labs    06/03/24 0405 06/04/24 0429  WBC 7.4 12.9*  HGB 8.0* 9.3*  HCT 24.6* 28.0*  PLT 80* 94*   BMET:  Recent Labs    06/03/24 1602 06/04/24 0429  NA 138 137  K 3.8 4.1  CL 103 102  CO2 27 29  GLUCOSE 117* 102*  BUN 13 16  CREATININE 0.90 0.92  CALCIUM  8.3* 8.6*    PT/INR:  Recent Labs    06/02/24 1317  LABPROT 17.1*  INR 1.3*   ABG    Component Value Date/Time   PHART 7.330 (L) 06/02/2024 1940   HCO3 25.1 06/02/2024 1940   TCO2 27 06/02/2024 1940   ACIDBASEDEF 1.0 06/02/2024 1940   O2SAT 98 06/02/2024 1940   CBG (last 3)  Recent Labs    06/03/24 2003 06/03/24 2346 06/04/24 0352  GLUCAP 108* 96 97   CXR: mild bibasilar  atelectasis  Assessment/Plan: S/P Procedures (LRB): CORONARY ARTERY BYPASS GRAFTING (CABG) TIMES 2 USING LEFT INTERNAL MAMMARY ARTERY AND ENDOSCOPICALLY HARVESTED RIGHT GREATER SAPHENOUS VEIN (N/A) ECHOCARDIOGRAM, TRANSESOPHAGEAL, INTRAOPERATIVE (N/A)  POD 2 Hemodynamically stable in NSR 80's. Will hold off on Lopressor  for now, possibly start tomorrow.  DC pacing wires this am and then all chest tubes 2 hrs later if stable.  -1335 cc yesterday. Wt down 7 lbs if accurate. 3 lbs over preop. Suspicious. Continue diuresis and IV KCL today.  Glucose under good control. DC CBG's  IS, OOB, ambulate.  Continue Reglan  and Zofran /phenergan  prn for nausea.   LOS: 2 days    Elizabeth Francis 06/04/2024

## 2024-06-04 NOTE — TOC Initial Note (Signed)
 Transition of Care [facility]) - Initial/Assessment Note    Patient Details  Name: Elizabeth Francis MRN: [id number] Date of Birth: 1955-07-04  [facility] CM/SW Contact:    Marval Gell, RN Phone Number: 06/04/2024, 12:09 PM  Clinical Narrative:                  Patient admitted with CABG x2 on 1/19.  [facility] able to offer [facility] services per TCTS office protocol if needed  ICM will continue  to follow as patient progresses   Expected Discharge Plan: Home w Home Health Services Barriers to Discharge: Continued Medical Work up   Patient Goals and CMS Choice            Expected Discharge Plan and Services                                              Prior Living Arrangements/Services                       Activities of Daily Living      Permission Sought/Granted                  Emotional Assessment              Admission diagnosis:  Coronary artery disease involving native coronary artery of native heart without angina pectoris [I25.10] S/P CABG x 2 [Z95.1] Patient Active Problem List   Diagnosis Date Noted   S/P CABG x 2 06/02/2024   Cardiac CT  04/17/2024 calcium  score 524, CAD RADS 4 greater than 50% left main stenosis, abnormal CTFFR 04/21/2024   Aortic atherosclerosis 03/11/2024   Dyspnea on exertion 03/11/2024   BMI 26.0-26.9,adult    Hypercholesterolemia    Mitral valve stenosis    Pure hypercholesterolemia    Chronic low back pain 12/12/2023   Dislocation of scapulothoracic joint 12/12/2023   Scapular dysfunction 12/12/2023   Chronic thoracic back pain 12/05/2023   Scapulalgia 12/05/2023   Backache 11/21/2023   PCP:  Ina Marcellus RAMAN, MD Pharmacy:   [id number] - [place], [place] - [address] [place] [address] Phone: [phone] Fax: [phone]     Social Drivers of Health (SDOH) Social History: SDOH Screenings   Tobacco Use: Medium Risk (05/29/2024)   SDOH Interventions:      Readmission Risk Interventions     No data to display

## 2024-06-04 NOTE — Progress Notes (Signed)
 TCTS PM Rounding Progress Note  Doing well this evening.  Walked multiple times today, pain has been controlled, been working on IS  Vitals:   06/04/24 1830 06/04/24 1831  BP: (!) 113/57 (!) 113/57  Pulse: 85 93  Resp: 16 17  Temp:    SpO2: 96% 96%    Plan: - Continue current care  Con Clunes, MD Cardiothoracic Surgery Pager: [phone]

## 2024-06-04 NOTE — Evaluation (Signed)
 Occupational Therapy Evaluation Patient Details Name: Elizabeth Francis MRN: [id number] DOB: 1956/03/04 Today's Date: 06/04/2024   History of Present Illness   The pt is a 69 yo female presenting 1/19 for CABG x2. PMH includes: aortic arthrosclerosis, arthritis, chronic back pain, CAD, and HLD.     Clinical Impressions PTA, pt lived with family and was mod I for BADL, IADL, and driving. Upon eval, pt presents with decreased activity tolerance and knowledge of sternal precautions limiting safe functional participation in BADL. Pt needing up to min A for BADL. Reviewed sternal precautions with pt able to recall 2/4 at end of session as well as how to implement during functional ADL with daughter present. Handout provided. Will continue to follow acutely; recommending HHOT; pending progress may not need.       If plan is discharge home, recommend the following:   A little help with walking and/or transfers;A little help with bathing/dressing/bathroom;Assistance with cooking/housework;Assist for transportation;Help with stairs or ramp for entrance     Functional Status Assessment   Patient has had a recent decline in their functional status and demonstrates the ability to make significant improvements in function in a reasonable and predictable amount of time.     Equipment Recommendations   BSC/3in1     Recommendations for Other Services         Precautions/Restrictions   Precautions Precautions: Sternal;Fall Precaution Booklet Issued: Yes (comment) Recall of Precautions/Restrictions: Intact Restrictions Weight Bearing Restrictions Per Provider Order: No Other Position/Activity Restrictions: cardiac sternal precautions     Mobility Bed Mobility Overal bed mobility: Needs Assistance Bed Mobility: Supine to Sit, Sit to Supine     Supine to sit: Mod assist, HOB elevated Sit to supine: Mod assist, +2 for physical assistance   General bed mobility comments: modA  to elevate trunk to sitting EOB, pt able to initiate movements wiht legs and assist with trunk, needs assist to complete and scoot to EOB. modA to manage LE back to bed    Transfers Overall transfer level: Needs assistance   Transfers: Sit to/from Stand Sit to Stand: Contact guard assist           General transfer comment: able to complete x5 without UE support and with CGA. VSS, good stability without UE support      Balance Overall balance assessment: Needs assistance Sitting-balance support: No upper extremity supported, Feet supported Sitting balance-Leahy Scale: Fair     Standing balance support: No upper extremity supported, During functional activity Standing balance-Leahy Scale: Fair Standing balance comment: poor tolerance for challenge, no overt LOB and able to manage lateral steps to [facility] [address]                           ADL either performed or assessed with clinical judgement   ADL Overall ADL's : Needs assistance/impaired Eating/Feeding: Sitting;Modified independent   Grooming: Contact guard assist;Standing   Upper Body Bathing: Minimal assistance;Sitting   Lower Body Bathing: Minimal assistance;Sit to/from stand   Upper Body Dressing : Minimal assistance;Sitting   Lower Body Dressing: Minimal assistance;Sit to/from stand   Toilet Transfer: Stand-pivot             General ADL Comments: pt just returned from functional ambulation in hall with EVA when OT arrived     Vision Baseline Vision/History: 1 Wears glasses Ability to See in Adequate Light: 0 Adequate Patient Visual Report: No change from baseline Vision Assessment?: No apparent visual deficits  Perception Perception: Within Functional Limits       Praxis Praxis: WFL       Pertinent Vitals/Pain Pain Assessment Pain Assessment: No/denies pain     Extremity/Trunk Assessment Upper Extremity Assessment Upper Extremity Assessment: Right hand dominant;Generalized weakness (WFL  given surgery)   Lower Extremity Assessment Lower Extremity Assessment: Defer to PT evaluation   Cervical / Trunk Assessment Cervical / Trunk Assessment: Kyphotic;Other exceptions Cervical / Trunk Exceptions: s/p cardiothroacic surgery, median sternotomy   Communication Communication Communication: No apparent difficulties   Cognition Arousal: Alert Behavior During Therapy: Flat affect Cognition: No apparent impairments             OT - Cognition Comments: fair carryover of precautions after initial education                 Following commands: Intact       Cueing  General Comments   Cueing Techniques: Verbal cues  VSS on RA   Exercises Exercises: Other exercises Other Exercises Other Exercises: sit-stand from EOB x5   Shoulder Instructions      Home Living Family/patient expects to be discharged to:: Private residence Living Arrangements: Spouse/significant other;Children Available Help at Discharge: Family;Available 24 hours/day (daughter on workers comp, at home, spouse available.) Type of Home: Mobile home Home Access: Stairs to enter Secretary/administrator of Steps: 4 Entrance Stairs-Rails: Right;Can reach both;Left Home Layout: One level     Bathroom Shower/Tub: Producer, Television/film/video: Handicapped height     Home Equipment: Grab bars - toilet;Cane - single International Aid/development Worker (2 wheels)          Prior Functioning/Environment Prior Level of Function : Independent/Modified Independent             Mobility Comments: no falls or DME, walking in house mostly, will walk through stores for errands ADLs Comments: pt prefers not to drive, hels daughter with cooking    OT Problem List: Decreased strength;Impaired balance (sitting and/or standing);Decreased knowledge of use of DME or AE;Decreased knowledge of precautions;Cardiopulmonary status limiting activity   OT Treatment/Interventions: Self-care/ADL  training;Therapeutic exercise;DME and/or AE instruction;Therapeutic activities;Patient/family education;Balance training      OT Goals(Current goals can be found in the care plan section)   Acute Rehab OT Goals Patient Stated Goal: get better OT Goal Formulation: With patient Time For Goal Achievement: 06/18/24 Potential to Achieve Goals: Good   OT Frequency:  Min 2X/week    Co-evaluation              AM-PAC OT 6 Clicks Daily Activity     Outcome Measure Help from another person eating meals?: None Help from another person taking care of personal grooming?: A Little Help from another person toileting, which includes using toliet, bedpan, or urinal?: A Little Help from another person bathing (including washing, rinsing, drying)?: A Little Help from another person to put on and taking off regular upper body clothing?: A Little Help from another person to put on and taking off regular lower body clothing?: A Little 6 Click Score: 19   End of Session Nurse Communication: Mobility status  Activity Tolerance: Patient tolerated treatment well Patient left: in bed;with call bell/phone within reach;with bed alarm set;with family/visitor present  OT Visit Diagnosis: Unsteadiness on feet (R26.81);Muscle weakness (generalized) (M62.81)                Time: 8577-8552 OT Time Calculation (min): 25 min Charges:  OT General Charges $OT Visit: 1 Visit OT Evaluation $OT Eval  Moderate Complexity: 1 Mod  Elma JONETTA Lebron FREDERICK, OTR/L [facility] Acute Rehabilitation Office: [phone]   Elma JONETTA Lebron 06/04/2024, 4:46 PM

## 2024-06-04 NOTE — Progress Notes (Signed)
 "  NAME:  Elizabeth Francis, MRN:  [id number], DOB:  January 01, 1956, LOS: 2 ADMISSION DATE:  06/02/2024, CONSULTATION DATE:  06/03/23  REFERRING MD:  Lucas  CHIEF COMPLAINT:  CABG x2  History of Present Illness:  Pt is a 69 yr old female with significant hx of CAD, HLD, smoking- until July 2024 but continues to vape, and GERD who presents for CABG x 2 by MD Bartle due to Left Main Coronary Artery Stenosis. PCCM consulted to assist with postop management/care while residing in ICU.    Cell Saver: 250 cc  Total Bypass Time: 74 Cross Clamp: 57  EBL: 150 cc    Pertinent  Medical History   Past Medical History:  Diagnosis Date   Aortic atherosclerosis 03/11/2024   Arthritis    Left Knee   Backache 11/21/2023   Blood clot in eye 1983   Left eye r/t birth control. 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   BMI 26.0-26.9,adult    Cardiac CT  04/17/2024 calcium  score 524, CAD RADS 4 greater than 50% left main stenosis, abnormal CTFFR 04/21/2024   Chronic low back pain 12/12/2023   Chronic thoracic back pain 12/05/2023   Complication of anesthesia    slow to wake up   Dislocation of scapulothoracic joint 12/12/2023   Dyspnea on exertion 03/11/2024   Headache    Migraines occasionally   Hypercholesterolemia    Mitral valve stenosis    Pneumonia    PONV (postoperative nausea and vomiting)    Pure hypercholesterolemia    Scapulalgia 12/05/2023   Scapular dysfunction 12/12/2023     Significant Hospital Events: Including procedures, antibiotic start and stop dates in addition to other pertinent events   1/19 PCCM consult, ICU management for CABG x 2  1/20 Improving, weaning off pressors, still remains on vaso, received 1 unit of PRBCs yesterday, plan for another unit for diuresis  1/21 stilll pushing diuretics and mobilization. PMW and CT removal   Interim History / Subjective:  POD2  No distress but didn't sleep well Weaning O2 Encouraging mobilization  PMW out   Objective     Blood pressure 137/72, pulse 92, temperature 98.9 F (37.2 C), temperature source Oral, resp. rate (!) 21, height 5' 1 (1.549 m), weight 65 kg, SpO2 95%. PAP: (28)/(13) 28/13 CVP:  [14 mmHg] 14 mmHg      Intake/Output Summary (Last 24 hours) at 06/04/2024 0954 Last data filed at 06/04/2024 0942 Gross per 24 hour  Intake 920.04 ml  Output 2960 ml  Net -2039.96 ml   Filed Weights   06/02/24 0602 06/03/24 0500 06/04/24 0500  Weight: 63.4 kg 68.2 kg 65 kg    Examination: General resting in bed no distress HENT NCAT no JVD MMM Pulm clear. Dec bases. No accessory use. IS efforts are poor ->54ml. Sats 94% on 1.5 liters. Desaturated earlier to 87% on room air. CXR: lines and tubes look good. Basilar atx. CT output since admit 660  Card rrr sternal dressing intact. No drainage noted post PMW removal  Ext still has LE edema. Brisk CR pulses strong Abd soft Neuro intact GU cl yellow   Resolved problem list   Assessment and Plan  Severe Left CAD s/p CABG x 2 -LIMA to LAD, SVG to OM HLD hx  Off pressors, hemodynamic stable Plan Continue postoperative management per TCTS Continuing IV diuresis, she still appears volume overload, I don't think her recorded weight is accuarate Holding off from BB today per surgical  team Removing Pacemaker wires and then CTs later today  Increase mobilization  Cont IS Pain control   Postoperative atelectasis Still requiring supplemental oxygen on 1.5 L -History of vaping. - Spirometry effort poor on I-S Plan Mobilize Encourage spirometry Pain control Encouraged to stop vaping  Expected perioperative blood loss anemia Expected thrombocytopenia due to CPB Hemoglobin 9.3 up from 8 after transfusion of blood on the 20th Platelets starting to trend up 80->90 Plan Continue to trend CBC SCDs Holding off on prophylactic anticoagulation given thrombocytopenia Transfuse for hemoglobin less than 8    Critical care time: NA My time 31 min  included: review of most recent records, direct face to face time obtaining history, performing physical exam, developing and documenting plan as well as discussing this plan with the patient and/or care givers.  99231 on-going, stable,recovering or improving problem >25 min         "

## 2024-06-04 NOTE — Evaluation (Signed)
 Physical Therapy Evaluation Patient Details Name: Elizabeth Francis MRN: [id number] DOB: 01/11/1956 Today's Date: 06/04/2024  History of Present Illness  The pt is a 69 yo female presenting 1/19 for CABG x2. PMH includes: aortic arthrosclerosis, arthritis, chronic back pain, CAD, and HLD.  Clinical Impression  Pt in bed upon arrival of PT, agreeable to evaluation at this time. Prior to admission the pt was independent with all mobility, reports independent with gait around the house and in community without falls. The pt required increased assist for bed mobility to maintain sternal precautions with bed mobility, but only CGA to complete sit-stand transfers from EOB without use of UE. Pt fatigued from ambulation with RN prior to session and declined further ambulation at this time, but was able to demo good stability with lateral stepping and standing marches without UE support. Will continue to follow acutely, anticipate pt will progress to be able to return home with family support, but will benefit from HHPT to progress functional independence with transfers, endurance, and reduce dependence on DME.      If plan is discharge home, recommend the following: A little help with walking and/or transfers;A little help with bathing/dressing/bathroom;Assistance with cooking/housework;Assist for transportation;Help with stairs or ramp for entrance   Can travel by private vehicle        Equipment Recommendations BSC/3in1  Recommendations for Other Services       Functional Status Assessment Patient has had a recent decline in their functional status and demonstrates the ability to make significant improvements in function in a reasonable and predictable amount of time.     Precautions / Restrictions Precautions Precautions: Sternal;Fall Precaution Booklet Issued: Yes (comment) Recall of Precautions/Restrictions: Intact Restrictions Weight Bearing Restrictions Per Provider Order: No Other  Position/Activity Restrictions: cardiac sternal precautions      Mobility  Bed Mobility Overal bed mobility: Needs Assistance Bed Mobility: Supine to Sit, Sit to Supine     Supine to sit: Mod assist, HOB elevated Sit to supine: Mod assist, +2 for physical assistance   General bed mobility comments: modA to elevate trunk to sitting EOB, pt able to initiate movements wiht legs and assist with trunk, needs assist to complete and scoot to EOB. modA to manage LE back to bed    Transfers Overall transfer level: Needs assistance   Transfers: Sit to/from Stand Sit to Stand: Contact guard assist           General transfer comment: able to complete x5 without UE support and with CGA. VSS, good stability without UE support    Ambulation/Gait Ambulation/Gait assistance: Contact guard assist Gait Distance (Feet): 5 Feet Assistive device: None Gait Pattern/deviations: Step-to pattern, Decreased stride length       General Gait Details: limited to lateral steps along EOB as pt fatigued from ambulation with RN prior to session    Balance Overall balance assessment: Needs assistance Sitting-balance support: No upper extremity supported, Feet supported Sitting balance-Leahy Scale: Fair     Standing balance support: No upper extremity supported, During functional activity Standing balance-Leahy Scale: Fair Standing balance comment: poor tolerance for challenge, no overt LOB and able to manage lateral steps to [facility]                             Pertinent Vitals/Pain Pain Assessment Pain Assessment: No/denies pain    Home Living Family/patient expects to be discharged to:: Private residence Living Arrangements: Spouse/significant other;Children Available Help at Discharge:  Family;Available 24 hours/day (daughter on workers comp, at home, spouse available.) Type of Home: Mobile home Home Access: Stairs to enter Entrance Stairs-Rails: Right;Can reach Social Worker of Steps: 4   Home Layout: One level Home Equipment: Grab bars - toilet;Cane - single International Aid/development Worker (2 wheels)      Prior Function Prior Level of Function : Independent/Modified Independent             Mobility Comments: no falls or DME, walking in house mostly, will walk through stores for errands ADLs Comments: pt prefers not to drive, hels daughter with cooking     Extremity/Trunk Assessment   Upper Extremity Assessment Upper Extremity Assessment: Right hand dominant;Defer to OT evaluation    Lower Extremity Assessment Lower Extremity Assessment: Generalized weakness (grossly 4-/5 to MMT, reports sensation intact)    Cervical / Trunk Assessment Cervical / Trunk Assessment: Kyphotic;Other exceptions Cervical / Trunk Exceptions: s/p cardiothroacic surgery, median sternotomy  Communication   Communication Communication: No apparent difficulties    Cognition Arousal: Alert Behavior During Therapy: Flat affect   PT - Cognitive impairments: No apparent impairments                       PT - Cognition Comments: WFL in session Following commands: Intact       Cueing Cueing Techniques: Verbal cues     General Comments General comments (skin integrity, edema, etc.): VSS on RA    Exercises Other Exercises Other Exercises: sit-stand from EOB x5   Assessment/Plan    PT Assessment Patient needs continued PT services  PT Problem List Decreased strength;Decreased activity tolerance;Decreased balance;Decreased mobility;Decreased knowledge of precautions       PT Treatment Interventions DME instruction;Gait training;Stair training;Functional mobility training;Therapeutic activities;Therapeutic exercise;Balance training;Patient/family education    PT Goals (Current goals can be found in the Care Plan section)  Acute Rehab PT Goals Patient Stated Goal: to return to independence PT Goal Formulation: With  patient/family Time For Goal Achievement: 06/18/24 Potential to Achieve Goals: Good    Frequency Min 2X/week        AM-PAC PT 6 Clicks Mobility  Outcome Measure Help needed turning from your back to your side while in a flat bed without using bedrails?: A Lot Help needed moving from lying on your back to sitting on the side of a flat bed without using bedrails?: A Lot Help needed moving to and from a bed to a chair (including a wheelchair)?: A Little Help needed standing up from a chair using your arms (e.g., wheelchair or bedside chair)?: A Little Help needed to walk in hospital room?: A Little Help needed climbing 3-5 steps with a railing? : A Lot 6 Click Score: 15    End of Session   Activity Tolerance: Patient tolerated treatment well Patient left: in bed;with call bell/phone within reach;with bed alarm set;with family/visitor present Nurse Communication: Mobility status PT Visit Diagnosis: Unsteadiness on feet (R26.81);Muscle weakness (generalized) (M62.81)    Time: 8577-8552 PT Time Calculation (min) (ACUTE ONLY): 25 min   Charges:   PT Evaluation $PT Eval Low Complexity: 1 Low   PT General Charges $$ ACUTE PT VISIT: 1 Visit         Izetta Call, PT, DPT   Acute Rehabilitation Department Office [phone] Secure Chat Communication Preferred   Izetta JULIANNA Call 06/04/2024, 3:14 PM

## 2024-06-05 LAB — CBC
HCT: 28.3 % — ABNORMAL LOW (ref 36.0–46.0)
Hemoglobin: 9.2 g/dL — ABNORMAL LOW (ref 12.0–15.0)
MCH: 26.9 pg (ref 26.0–34.0)
MCHC: 32.5 g/dL (ref 30.0–36.0)
MCV: 82.7 fL (ref 80.0–100.0)
Platelets: 101 K/uL — ABNORMAL LOW (ref 150–400)
RBC: 3.42 MIL/uL — ABNORMAL LOW (ref 3.87–5.11)
RDW: 15.6 % — ABNORMAL HIGH (ref 11.5–15.5)
WBC: 8.4 K/uL (ref 4.0–10.5)
nRBC: 0 % (ref 0.0–0.2)

## 2024-06-05 LAB — BASIC METABOLIC PANEL WITH GFR
Anion gap: 6 (ref 5–15)
BUN: 17 mg/dL (ref 8–23)
CO2: 28 mmol/L (ref 22–32)
Calcium: 8.5 mg/dL — ABNORMAL LOW (ref 8.9–10.3)
Chloride: 103 mmol/L (ref 98–111)
Creatinine, Ser: 0.73 mg/dL (ref 0.44–1.00)
GFR, Estimated: >60 mL/min
Glucose, Bld: 87 mg/dL (ref 70–99)
Potassium: 3.8 mmol/L (ref 3.5–5.1)
Sodium: 138 mmol/L (ref 135–145)

## 2024-06-05 MED ORDER — ASPIRIN 81 MG PO TBEC
81.0000 mg | DELAYED_RELEASE_TABLET | Freq: Every day | ORAL | Status: DC
  Administered 2024-06-06: 81 mg via ORAL
  Filled 2024-06-05: qty 1

## 2024-06-05 MED ORDER — DOCUSATE SODIUM 100 MG PO CAPS
200.0000 mg | ORAL_CAPSULE | Freq: Every day | ORAL | Status: DC
  Administered 2024-06-06: 200 mg via ORAL
  Filled 2024-06-05: qty 2

## 2024-06-05 MED ORDER — SODIUM CHLORIDE 0.9 % IV SOLN: 250.0000 mL | INTRAVENOUS | Status: DC | PRN

## 2024-06-05 MED ORDER — ~~LOC~~ CARDIAC SURGERY, PATIENT & FAMILY EDUCATION: Freq: Once | Status: DC

## 2024-06-05 MED ORDER — ENOXAPARIN SODIUM 40 MG/0.4ML IJ SOSY
40.0000 mg | PREFILLED_SYRINGE | INTRAMUSCULAR | Status: DC
  Filled 2024-06-05: qty 0.4

## 2024-06-05 MED ORDER — SODIUM CHLORIDE 0.9% FLUSH: 3.0000 mL | INTRAVENOUS | Status: DC | PRN

## 2024-06-05 MED ORDER — METOPROLOL TARTRATE 12.5 MG HALF TABLET
12.5000 mg | ORAL_TABLET | Freq: Two times a day (BID) | ORAL | Status: DC
  Administered 2024-06-05 (×2): 12.5 mg via ORAL
  Filled 2024-06-05 (×2): qty 1

## 2024-06-05 MED ORDER — SODIUM CHLORIDE 0.9% FLUSH
3.0000 mL | Freq: Two times a day (BID) | INTRAVENOUS | Status: DC
  Administered 2024-06-05 – 2024-06-06 (×3): 3 mL via INTRAVENOUS

## 2024-06-05 MED ORDER — POTASSIUM CHLORIDE CRYS ER 20 MEQ PO TBCR
20.0000 meq | EXTENDED_RELEASE_TABLET | Freq: Every day | ORAL | Status: AC
  Administered 2024-06-05 – 2024-06-06 (×2): 20 meq via ORAL
  Filled 2024-06-05 (×2): qty 1

## 2024-06-05 MED ORDER — FUROSEMIDE 40 MG PO TABS
40.0000 mg | ORAL_TABLET | Freq: Every day | ORAL | Status: DC
  Administered 2024-06-05: 40 mg via ORAL
  Filled 2024-06-05: qty 1

## 2024-06-05 NOTE — Progress Notes (Signed)
 3 Days Post-Op Procedures (LRB): CORONARY ARTERY BYPASS GRAFTING (CABG) TIMES 2 USING LEFT INTERNAL MAMMARY ARTERY AND ENDOSCOPICALLY HARVESTED RIGHT GREATER SAPHENOUS VEIN (N/A) ECHOCARDIOGRAM, TRANSESOPHAGEAL, INTRAOPERATIVE (N/A) Subjective: Feels ok. No BM yet. Appetite not great but she is eating her breakfast.  Objective: Vital signs in last 24 hours: Temp:  [97.8 F (36.6 C)-98.9 F (37.2 C)] 98.6 F (37 C) (01/22 0334) Pulse Rate:  [68-102] 68 (01/22 0500) Cardiac Rhythm: Normal sinus rhythm (01/22 0000) Resp:  [12-30] 13 (01/22 0500) BP: (90-143)/(45-77) 129/62 (01/22 0500) SpO2:  [89 %-97 %] 93 % (01/22 0500) Weight:  [64.9 kg] 64.9 kg (01/22 0500)  Hemodynamic parameters for last 24 hours:    Intake/Output from previous day: 01/21 0701 - 01/22 0700 In: 153 [I.V.:3; IV Piggyback:150] Out: 1660 [Urine:1650; Chest Tube:10] Intake/Output this shift: No intake/output data recorded.  General appearance: alert and cooperative Neurologic: intact Heart: regular rate and rhythm Lungs: clear to auscultation bilaterally Extremities: minimal LE edema Wound: incisions healing well  Lab Results: Recent Labs    06/04/24 0429 06/05/24 0510  WBC 12.9* 8.4  HGB 9.3* 9.2*  HCT 28.0* 28.3*  PLT 94* 101*   BMET:  Recent Labs    06/03/24 1602 06/04/24 0429  NA 138 137  K 3.8 4.1  CL 103 102  CO2 27 29  GLUCOSE 117* 102*  BUN 13 16  CREATININE 0.90 0.92  CALCIUM  8.3* 8.6*    PT/INR:  Recent Labs    06/02/24 1317  LABPROT 17.1*  INR 1.3*   ABG    Component Value Date/Time   PHART 7.330 (L) 06/02/2024 1940   HCO3 25.1 06/02/2024 1940   TCO2 27 06/02/2024 1940   ACIDBASEDEF 1.0 06/02/2024 1940   O2SAT 98 06/02/2024 1940   CBG (last 3)  Recent Labs    06/04/24 0853 06/04/24 1218 06/04/24 1634  GLUCAP 98 98 95    Assessment/Plan: S/P Procedures (LRB): CORONARY ARTERY BYPASS GRAFTING (CABG) TIMES 2 USING LEFT INTERNAL MAMMARY ARTERY AND  ENDOSCOPICALLY HARVESTED RIGHT GREATER SAPHENOUS VEIN (N/A) ECHOCARDIOGRAM, TRANSESOPHAGEAL, INTRAOPERATIVE (N/A)  POD 3  Hemodynamically stable in NSR 90's. Will resume Lopressor  12.5 bid.  -1500 cc yesterday but wt unchanged. 3 lbs over preop. Continue diuresis for a couple more days.  Thrombocytopenia resolving. Start Lovenox .  Transfer to 4E and continue mobilization, IS.    LOS: 3 days    Elizabeth Francis 06/05/2024

## 2024-06-05 NOTE — Plan of Care (Signed)

## 2024-06-05 NOTE — Progress Notes (Signed)
" ° °  NAME:  Elizabeth Francis, MRN:  [id number], DOB:  01-Aug-1955, LOS: 3 ADMISSION DATE:  06/02/2024, CONSULTATION DATE:  06/03/23  REFERRING MD:  Lucas  CHIEF COMPLAINT:  CABG x2  History of Present Illness:  Pt is a 69 yr old female with significant hx of CAD, HLD, smoking- until July 2024 but continues to vape, and GERD who presents for CABG x 2 by MD Bartle due to Left Main Coronary Artery Stenosis. PCCM consulted to assist with postop management/care while residing in ICU.    Cell Saver: 250 cc  Total Bypass Time: 74 Cross Clamp: 57  EBL: 150 cc   Pertinent  Medical History   Past Medical History:  Diagnosis Date   Aortic atherosclerosis 03/11/2024   Arthritis    Left Knee   Backache 11/21/2023   Blood clot in eye 1983   Left eye r/t birth control. 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   BMI 26.0-26.9,adult    Cardiac CT  04/17/2024 calcium  score 524, CAD RADS 4 greater than 50% left main stenosis, abnormal CTFFR 04/21/2024   Chronic low back pain 12/12/2023   Chronic thoracic back pain 12/05/2023   Complication of anesthesia    slow to wake up   Dislocation of scapulothoracic joint 12/12/2023   Dyspnea on exertion 03/11/2024   Headache    Migraines occasionally   Hypercholesterolemia    Mitral valve stenosis    Pneumonia    PONV (postoperative nausea and vomiting)    Pure hypercholesterolemia    Scapulalgia 12/05/2023   Scapular dysfunction 12/12/2023     Significant Hospital Events: Including procedures, antibiotic start and stop dates in addition to other pertinent events   1/19 PCCM consult, ICU management for CABG x 2  1/20 Improving, weaning off pressors, still remains on vaso, received 1 unit of PRBCs yesterday, plan for another unit for diuresis  1/21 stilll pushing diuretics and mobilization. PMW and CT removal   Interim History / Subjective:  Increased mobilization over past 24 hrs. Progressing.  Objective    Blood pressure (!) 152/109,  pulse 80, temperature 97.8 F (36.6 C), temperature source Oral, resp. rate 18, height 5' 1 (1.549 m), weight 64.9 kg, SpO2 94%.        Intake/Output Summary (Last 24 hours) at 06/05/2024 2211 Last data filed at 06/05/2024 0800 Gross per 24 hour  Intake 3 ml  Output 240 ml  Net -237 ml   Filed Weights   06/03/24 0500 06/04/24 0500 06/05/24 0500  Weight: 68.2 kg 65 kg 64.9 kg    Examination: General: resting in bed no distress HENT NCAT no JVD MMM Pulm: No increased WOB Cor: rrr sternal dressing intact.   Ext still has LE edema. Abd soft Neuro intact GU: Foley  Resolved problem list   Assessment and Plan  Severe Left CAD s/p CABG x 2 -LIMA to LAD, SVG to OM HLD hx  Plan Continue postoperative management per TCTS Continue diuresis Resume beta blocker today - 12.5 bid Cont mobilization  Cont IS Pain control   Postoperative atelectasis Plan Mobilize Encourage spirometry Pain control Encouraged to stop vaping   MDM Level: High  Lamar Dales, MD Pulmonary, Critical Care & Sleep Medicine [facility]  7a-7p: For contact information, see AMION. If no response to pager, please call PCCM 2-H APP. After 7p: Please call PCCM APP on-call for 2-H.  "

## 2024-06-05 NOTE — Progress Notes (Signed)
 EVENING ROUNDS NOTE :     [address] [employer] [address]             [phone]               3 Days Post-Op Procedures (LRB): CORONARY ARTERY BYPASS GRAFTING (CABG) TIMES 2 USING LEFT INTERNAL MAMMARY ARTERY AND ENDOSCOPICALLY HARVESTED RIGHT GREATER SAPHENOUS VEIN (N/A) ECHOCARDIOGRAM, TRANSESOPHAGEAL, INTRAOPERATIVE (N/A)   Total Length of Stay:  LOS: 3 days  Events:   No events Stable day Resting comft    BP 123/64   Pulse 79   Temp 98.3 F (36.8 C) (Oral)   Resp (!) 22   Ht 5' 1 (1.549 m)   Wt 64.9 kg   SpO2 94%   BMI 27.04 kg/m          sodium chloride      promethazine  (PHENERGAN ) injection (IM or IVPB) Stopped (06/03/24 0752)    I/O last 3 completed shifts: In: 546.5 [I.V.:3; IV Piggyback:543.5] Out: 2815 [Urine:2685; Chest Tube:130]      Latest Ref Rng & Units 06/05/2024    5:10 AM 06/04/2024    4:29 AM 06/03/2024    4:05 AM  CBC  WBC 4.0 - 10.5 K/uL 8.4  12.9  7.4   Hemoglobin 12.0 - 15.0 g/dL 9.2  9.3  8.0   Hematocrit 36.0 - 46.0 % 28.3  28.0  24.6   Platelets 150 - 400 K/uL 101  94  80        Latest Ref Rng & Units 06/05/2024    5:10 AM 06/04/2024    4:29 AM 06/03/2024    4:02 PM  BMP  Glucose 70 - 99 mg/dL 87  897  882   BUN 8 - 23 mg/dL 17  16  13    Creatinine 0.44 - 1.00 mg/dL 9.26  9.07  9.09   Sodium 135 - 145 mmol/L 138  137  138   Potassium 3.5 - 5.1 mmol/L 3.8  4.1  3.8   Chloride 98 - 111 mmol/L 103  102  103   CO2 22 - 32 mmol/L 28  29  27    Calcium  8.9 - 10.3 mg/dL 8.5  8.6  8.3     ABG    Component Value Date/Time   PHART 7.330 (L) 06/02/2024 1940   PCO2ART 46.7 06/02/2024 1940   PO2ART 112 (H) 06/02/2024 1940   HCO3 25.1 06/02/2024 1940   TCO2 27 06/02/2024 1940   ACIDBASEDEF 1.0 06/02/2024 1940   O2SAT 98 06/02/2024 1940       Elizabeth Rayas, MD 06/05/2024 5:28 PM

## 2024-06-06 ENCOUNTER — Ambulatory Visit

## 2024-06-06 LAB — TYPE AND SCREEN
ABO/RH(D): A POS
Antibody Screen: NEGATIVE
Unit division: 0
Unit division: 0
Unit division: 0
Unit division: 0

## 2024-06-06 LAB — CBC
HCT: 32.3 % — ABNORMAL LOW (ref 36.0–46.0)
Hemoglobin: 10.6 g/dL — ABNORMAL LOW (ref 12.0–15.0)
MCH: 27 pg (ref 26.0–34.0)
MCHC: 32.8 g/dL (ref 30.0–36.0)
MCV: 82.2 fL (ref 80.0–100.0)
Platelets: 143 K/uL — ABNORMAL LOW (ref 150–400)
RBC: 3.93 MIL/uL (ref 3.87–5.11)
RDW: 15.5 % (ref 11.5–15.5)
WBC: 8 K/uL (ref 4.0–10.5)
nRBC: 0 % (ref 0.0–0.2)

## 2024-06-06 LAB — BPAM RBC
Blood Product Expiration Date: 202601232359
Blood Product Expiration Date: 202601312359
Blood Product Expiration Date: 202602012359
Blood Product Expiration Date: 202602022359
ISSUE DATE / TIME: 202601151754
ISSUE DATE / TIME: 202601191440
ISSUE DATE / TIME: 202601200756
Unit Type and Rh: 6200
Unit Type and Rh: 6200
Unit Type and Rh: 6200
Unit Type and Rh: 6200

## 2024-06-06 LAB — BASIC METABOLIC PANEL WITH GFR
Anion gap: 8 (ref 5–15)
BUN: 11 mg/dL (ref 8–23)
CO2: 27 mmol/L (ref 22–32)
Calcium: 8.9 mg/dL (ref 8.9–10.3)
Chloride: 104 mmol/L (ref 98–111)
Creatinine, Ser: 0.68 mg/dL (ref 0.44–1.00)
GFR, Estimated: >60 mL/min
Glucose, Bld: 86 mg/dL (ref 70–99)
Potassium: 4 mmol/L (ref 3.5–5.1)
Sodium: 138 mmol/L (ref 135–145)

## 2024-06-06 MED ORDER — METOPROLOL TARTRATE 25 MG PO TABS: 25.0000 mg | ORAL_TABLET | Freq: Two times a day (BID) | ORAL | 5 refills | Status: AC

## 2024-06-06 MED ORDER — TRAMADOL HCL 50 MG PO TABS: 50.0000 mg | ORAL_TABLET | Freq: Four times a day (QID) | ORAL | 0 refills | Status: AC | PRN

## 2024-06-06 MED ORDER — ACETAMINOPHEN 325 MG PO TABS: 650.0000 mg | ORAL_TABLET | Freq: Four times a day (QID) | ORAL | Status: AC | PRN

## 2024-06-06 MED ORDER — ~~LOC~~ CARDIAC SURGERY, PATIENT & FAMILY EDUCATION: 1.0000 | Freq: Once | Status: AC

## 2024-06-06 MED ORDER — METOPROLOL TARTRATE 25 MG PO TABS
25.0000 mg | ORAL_TABLET | Freq: Two times a day (BID) | ORAL | Status: DC
  Administered 2024-06-06: 25 mg via ORAL
  Filled 2024-06-06: qty 1

## 2024-06-06 MED ORDER — CHLORHEXIDINE GLUCONATE CLOTH 2 % EX PADS
6.0000 | MEDICATED_PAD | Freq: Every day | CUTANEOUS | Status: DC
  Administered 2024-06-06: 6 via TOPICAL

## 2024-06-06 NOTE — Progress Notes (Signed)
 Occupational Therapy Treatment Patient Details Name: Elizabeth Francis MRN: [id number] DOB: 06-12-1955 Today's Date: 06/06/2024   History of present illness The pt is a 69 yo female presenting 1/19 for CABG x2. PMH includes: aortic arthrosclerosis, arthritis, chronic back pain, CAD, and HLD.   OT comments  Pt progressing well towards goals. Progressed to complete standing grooming tasks with supervision. Pt required min assist for LB dressing to don shoes. Overall pt with fair carryover of education regarding sternal precautions; however continues to require minimal cueing for compensatory strategies. Continue to recommend HHOT to optimize independence levels. Will continue to follow acutely.       If plan is discharge home, recommend the following:  A little help with walking and/or transfers;A little help with bathing/dressing/bathroom;Assistance with cooking/housework;Assist for transportation;Help with stairs or ramp for entrance   Equipment Recommendations  BSC/3in1       Precautions / Restrictions Precautions Precautions: Sternal;Fall Precaution Booklet Issued: Yes (comment) Recall of Precautions/Restrictions: Intact Restrictions Weight Bearing Restrictions Per Provider Order: No Other Position/Activity Restrictions: cardiac sternal precautions       Mobility Bed Mobility Overal bed mobility: Modified Independent       General bed mobility comments: HOB elevated, good adherance to precautions    Transfers Overall transfer level: Needs assistance Equipment used: None Transfers: Sit to/from Stand Sit to Stand: Supervision           General transfer comment: S for safety short distance in room no AD     Balance Overall balance assessment: Needs assistance Sitting-balance support: No upper extremity supported, Feet supported Sitting balance-Leahy Scale: Fair     Standing balance support: No upper extremity supported, During functional activity Standing  balance-Leahy Scale: Good     ADL either performed or assessed with clinical judgement   ADL Overall ADL's : Needs assistance/impaired     Grooming: Supervision/safety;Standing           Upper Body Dressing : Supervision/safety;Sitting   Lower Body Dressing: Minimal assistance;Sit to/from stand Lower Body Dressing Details (indicate cue type and reason): Assist for shoes, and cueing for technique Toilet Transfer: Supervision/safety;Ambulation       Functional mobility during ADLs: Supervision/safety General ADL Comments: cues for compensatory strategies    Extremity/Trunk Assessment Upper Extremity Assessment Upper Extremity Assessment: Right hand dominant;Generalized weakness   Lower Extremity Assessment Lower Extremity Assessment: Defer to PT evaluation        Vision   Vision Assessment?: No apparent visual deficits         Communication Communication Communication: No apparent difficulties   Cognition Arousal: Alert Behavior During Therapy: WFL for tasks assessed/performed Cognition: No apparent impairments       Following commands: Intact        Cueing   Cueing Techniques: Verbal cues        General Comments Spouse present for education    Pertinent Vitals/ Pain       Pain Assessment Pain Assessment: No/denies pain   Frequency  Min 2X/week        Progress Toward Goals  OT Goals(current goals can now be found in the care plan section)  Progress towards OT goals: Progressing toward goals  Acute Rehab OT Goals Patient Stated Goal: To go home OT Goal Formulation: With patient Time For Goal Achievement: 06/18/24 Potential to Achieve Goals: Good ADL Goals Additional ADL Goal #1: pt will be mod I for OOB ADL within sternal precautions without cues Additional ADL Goal #2: pt will indentify and implement  2+ EC techniques to use in home setting.  Plan         AM-PAC OT 6 Clicks Daily Activity     Outcome Measure   Help from another  person eating meals?: None Help from another person taking care of personal grooming?: A Little Help from another person toileting, which includes using toliet, bedpan, or urinal?: A Little Help from another person bathing (including washing, rinsing, drying)?: A Little Help from another person to put on and taking off regular upper body clothing?: A Little Help from another person to put on and taking off regular lower body clothing?: A Little 6 Click Score: 19    End of Session    OT Visit Diagnosis: Unsteadiness on feet (R26.81);Muscle weakness (generalized) (M62.81)   Activity Tolerance Patient tolerated treatment well   Patient Left in bed;with call bell/phone within reach;with family/visitor present   Nurse Communication Mobility status        Time: 9193-9167 OT Time Calculation (min): 26 min  Charges: OT General Charges $OT Visit: 1 Visit OT Treatments $Self Care/Home Management : 23-37 mins  Adrianne BROCKS, OT  Acute Rehabilitation Services Office [phone] Secure chat preferred   Adrianne GORMAN Savers 06/06/2024, 10:18 AM

## 2024-06-06 NOTE — TOC Transition Note (Signed)
 Transition of Care (TOC) - Discharge Note Rayfield Gobble RN, BSN Inpatient Care Management Unit 4E- RN Case Manager See Treatment Team for direct phone #   Patient Details  Name: Elizabeth Francis MRN: [id number] Date of Birth: 1956/02/10  Transition of [facility]) CM/SW Contact:  Elizabeth Rayfield Hurst, RN Phone Number: 06/06/2024, 11:33 AM   Clinical Narrative:    Pt stable for transition home today, TCTS office made pre-op referral for [facility] needs w/ [facility]. CM spoke with liaison who will follow up and initiate office protocol if needed.   Family to transport home.   IP CM interventions have been completed no further needs noted.   Final next level of care: Home w Home Health Services Barriers to Discharge: Barriers Resolved   Patient Goals and CMS Choice Patient states their goals for this hospitalization and ongoing recovery are:: return home and recover   Choice offered to / list presented to :  (TCTS office referral for [facility])      Discharge Placement                 Home w/ [facility]      Discharge Plan and Services Additional resources added to the After Visit Summary for     Discharge Planning Services: CM Consult            DME Arranged: N/A DME Agency: NA       HH Arranged: PT, RN HH Agency: [facility] Date HH Agency Contacted: 06/06/24   Representative spoke with at [facility] Agency: [place]-  Social Drivers of Health (SDOH) Interventions SDOH Screenings   Tobacco Use: Medium Risk (05/29/2024)     Readmission Risk Interventions    06/06/2024   11:33 AM  Readmission Risk Prevention Plan  Post Dischage Appt Complete  Medication Screening Complete  Transportation Screening Complete

## 2024-06-06 NOTE — Progress Notes (Signed)
 4 Days Post-Op Procedures (LRB): CORONARY ARTERY BYPASS GRAFTING (CABG) TIMES 2 USING LEFT INTERNAL MAMMARY ARTERY AND ENDOSCOPICALLY HARVESTED RIGHT GREATER SAPHENOUS VEIN (N/A) ECHOCARDIOGRAM, TRANSESOPHAGEAL, INTRAOPERATIVE (N/A) Subjective:  No complaints, feels well. Ambulated 1.5 laps around the ICU this am. Bowels working. Wants to go home. Husband stayed the night with her.    Objective: Vital signs in last 24 hours: Temp:  [97.7 F (36.5 C)-98.3 F (36.8 C)] 97.7 F (36.5 C) (01/23 0328) Pulse Rate:  [62-98] 78 (01/23 0600) Cardiac Rhythm: Normal sinus rhythm (01/23 0330) Resp:  [13-23] 18 (01/23 0600) BP: (97-160)/(50-109) 128/59 (01/23 0600) SpO2:  [92 %-97 %] 93 % (01/23 0600) Weight:  [63.1 kg] 63.1 kg (01/23 0329)  Hemodynamic parameters for last 24 hours:    Intake/Output from previous day: 01/22 0701 - 01/23 0700 In: -  Out: 45 [Urine:45] Intake/Output this shift: No intake/output data recorded.  General appearance: alert and cooperative Neurologic: intact Heart: regular rate and rhythm Lungs: clear to auscultation bilaterally Extremities: no edema Wound: incisions healing well.  Lab Results: Recent Labs    06/05/24 0510 06/06/24 0636  WBC 8.4 8.0  HGB 9.2* 10.6*  HCT 28.3* 32.3*  PLT 101* 143*   BMET:  Recent Labs    06/04/24 0429 06/05/24 0510  NA 137 138  K 4.1 3.8  CL 102 103  CO2 29 28  GLUCOSE 102* 87  BUN 16 17  CREATININE 0.92 0.73  CALCIUM  8.6* 8.5*    PT/INR: No results for input(s): LABPROT, INR in the last 72 hours. ABG    Component Value Date/Time   PHART 7.330 (L) 06/02/2024 1940   HCO3 25.1 06/02/2024 1940   TCO2 27 06/02/2024 1940   ACIDBASEDEF 1.0 06/02/2024 1940   O2SAT 98 06/02/2024 1940   CBG (last 3)  Recent Labs    06/04/24 0853 06/04/24 1218 06/04/24 1634  GLUCAP 98 98 95    Assessment/Plan: S/P Procedures (LRB): CORONARY ARTERY BYPASS GRAFTING (CABG) TIMES 2 USING LEFT INTERNAL MAMMARY  ARTERY AND ENDOSCOPICALLY HARVESTED RIGHT GREATER SAPHENOUS VEIN (N/A) ECHOCARDIOGRAM, TRANSESOPHAGEAL, INTRAOPERATIVE (N/A)  POD 4 She is hemodynamically stable in NSR 90. Will increase Lopressor  to 25 bid since BP is good.  Wt is back to baseline and no edema so I don't think she needs further diuresis.  Plan to send home today. Will send home on Plavix 75 and ASA 81 given the degree of disease and relatively small coronaries.   LOS: 4 days    Dorise MARLA Fellers 06/06/2024

## 2024-06-06 NOTE — Progress Notes (Signed)
 Physical Therapy Treatment Patient Details Name: Elizabeth Francis MRN: [id number] DOB: [birth year] Today's Date: 06/06/2024   History of Present Illness The pt is a 69 yo female presenting 1/19 for CABG x2. PMH includes: aortic arthrosclerosis, arthritis, chronic back pain, CAD, and HLD.    PT Comments  Pt able to perform stair training prior to discharge home. Pt up walking independently around room on entry packing her bags. Pt exhibits good recall of sternal precautions by asking husband to lift bag of belonging from down in closet to up on bed for her. Pt able to step up and over single step x4 to mimic home set up with supervision. On final bout, pt unable to clear L foot with power up of R foot causing her to have to step back to gain balance. PT advises pt to have husband close to her to provide assist as needed for ascending steps. Pt looking forward to going home today.    If plan is discharge home, recommend the following: A little help with walking and/or transfers;A little help with bathing/dressing/bathroom;Assistance with cooking/housework;Assist for transportation;Help with stairs or ramp for entrance   Can travel by private vehicle      Yes  Equipment Recommendations  BSC/3in1       Precautions / Restrictions Precautions Precautions: Sternal;Fall Precaution Booklet Issued: Yes (comment) Recall of Precautions/Restrictions: Intact Precaution/Restrictions Comments: exhibits good practice of sternal precautions, asking husband to lift bag from closet to bed Restrictions Weight Bearing Restrictions Per Provider Order: No Other Position/Activity Restrictions: cardiac sternal precautions     Mobility    Ambulation/Gait Ambulation/Gait assistance: Independent Gait Distance (Feet): 20 Feet Assistive device: None Gait Pattern/deviations: Step-through pattern, WFL(Within Functional Limits) Gait velocity: WFL for walking around room Gait velocity interpretation: <1.31 ft/sec,  indicative of household ambulator   General Gait Details: up walking in room packing up belongings   Stairs Stairs: Yes Stairs assistance: Supervision Stair Management: No rails, Forwards Number of Stairs: 1 (x4) General stair comments: pt is able to climb up and over one step four times, on 4th set pt unable to power up with R foot high enough to bring L foot to step surface and needs to step back down with slightly decreased control, recommend that husband stands close as she goes up and down the stairs to steady if needed.       Balance Overall balance assessment: Needs assistance Sitting-balance support: No upper extremity supported, Feet supported Sitting balance-Leahy Scale: Fair     Standing balance support: No upper extremity supported, During functional activity Standing balance-Leahy Scale: Good                              Communication Communication Communication: No apparent difficulties  Cognition Arousal: Alert Behavior During Therapy: Flat affect   PT - Cognitive impairments: No apparent impairments                       PT - Cognition Comments: WFL in session Following commands: Intact      Cueing Cueing Techniques: Verbal cues     General Comments General comments (skin integrity, edema, etc.): VSS on RA      Pertinent Vitals/Pain Pain Assessment Pain Assessment: No/denies pain    Home Living Family/patient expects to be discharged to:: Private residence Living Arrangements: Spouse/significant other  PT Goals (current goals can now be found in the care plan section) Acute Rehab PT Goals Patient Stated Goal: to return to independence PT Goal Formulation: With patient/family Time For Goal Achievement: 06/18/24 Potential to Achieve Goals: Good Progress towards PT goals: Progressing toward goals    Frequency    Min 2X/week       AM-PAC PT 6 Clicks Mobility   Outcome Measure   Help needed turning from your back to your side while in a flat bed without using bedrails?: A Lot Help needed moving from lying on your back to sitting on the side of a flat bed without using bedrails?: A Lot Help needed moving to and from a bed to a chair (including a wheelchair)?: A Little Help needed standing up from a chair using your arms (e.g., wheelchair or bedside chair)?: A Little Help needed to walk in Francis room?: A Little Help needed climbing 3-5 steps with a railing? : A Lot 6 Click Score: 15    End of Session   Activity Tolerance: Patient tolerated treatment well Patient left: with call bell/phone within reach;with family/visitor present Nurse Communication: Mobility status PT Visit Diagnosis: Unsteadiness on feet (R26.81);Muscle weakness (generalized) (M62.81)     Time: 9167-9157 PT Time Calculation (min) (ACUTE ONLY): 10 min  Charges:    $Gait Training: 8-22 mins PT General Charges $$ ACUTE PT VISIT: 1 Visit                     Elizabeth Francis B. Elizabeth Francis PT, DPT Acute Rehabilitation Services Please use secure chat or  Call Office [phone]    Elizabeth Francis Elizabeth Francis 06/06/2024, 8:51 AM

## 2024-06-06 NOTE — Plan of Care (Signed)
" °  Problem: Clinical Measurements: Goal: Ability to maintain clinical measurements within normal limits will improve Outcome: Progressing Goal: Respiratory complications will improve Outcome: Progressing Goal: Cardiovascular complication will be avoided Outcome: Progressing   Problem: Activity: Goal: Risk for activity intolerance will decrease Outcome: Progressing   Problem: Nutrition: Goal: Adequate nutrition will be maintained Outcome: Progressing   Problem: Coping: Goal: Level of anxiety will decrease Outcome: Progressing   Problem: Pain Managment: Goal: General experience of comfort will improve and/or be controlled Outcome: Progressing   "

## 2024-06-06 NOTE — Progress Notes (Signed)
 CARDIAC REHAB PHASE I    Patient and husband educated at bedside. Patient feeling well and ready for discharge from 2H. Patient states she ambulated this am with no dyspnea, dizziness, or angina. Post OHS education provided focusing on site care, restrictions, heart healthy diet, sternal precautions, IS use at home, home needs at discharge, exercise guidelines, and CRP2 reviewed. Patient prefers [place] location. All questions and concerns addressed. Will refer to [facility] for CRP2.    [id number] Elizabeth JAYSON [place], RN BSN 06/06/2024 8:36 AM

## 2024-06-07 LAB — ECHO INTRAOPERATIVE TEE
AR max vel: 1.77 cm2
AV Area VTI: 1.69 cm2
AV Area mean vel: 1.66 cm2
AV Mean grad: 4 mmHg
AV Peak grad: 7.7 mmHg
Ao pk vel: 1.39 m/s
Area-P 1/2: 2.06 cm2
Height: 61 in
MV VTI: 2.27 cm2
Weight: 2236.8 [oz_av]

## 2024-06-09 ENCOUNTER — Telehealth: Payer: Self-pay

## 2024-06-09 NOTE — Transitions of Care (Post Inpatient/ED Visit) (Signed)
 "  06/09/2024  Name: Elizabeth Francis MRN: [id number] DOB: 1956/04/26  Today's TOC FU Call Status: Today's TOC FU Call Status:: Successful TOC FU Call Completed TOC FU Call Complete Date: 06/09/24  Patient's Name and Date of Birth confirmed. DOB, Name  Transition Care Management Follow-up Telephone Call Date of Discharge: 06/06/24 Discharge Facility: [facility]) Type of Discharge: Inpatient Admission Primary Inpatient Discharge Diagnosis:: CAD - CABGx2 How have you been since you were released from the hospital?: Better Any questions or concerns?: No  Items Reviewed: Did you receive and understand the discharge instructions provided?: Yes Medications obtained,verified, and reconciled?: Yes (Medications Reviewed) Any new allergies since your discharge?: No Dietary orders reviewed?: Yes Type of Diet Ordered:: Heart healthy Do you have support at home?: Yes People in Home [RPT]: spouse Name of Support/Comfort Primary Source: 2 adult children live there as well  Medications Reviewed Today: Medications Reviewed Today     Reviewed by Lauro Shona LABOR, RN (Registered Nurse) on 06/09/24 at 1013  Med List Status: <None>   Medication Order Taking? Sig Documenting Provider Last Dose Status Informant  acetaminophen  (TYLENOL ) 325 MG tablet [id number] Yes Take 2 tablets (650 mg total) by mouth every 6 (six) hours as needed. Roddenberry, Myron G, PA-C  Active   aspirin  EC 81 MG tablet [id number] Yes Take 1 tablet (81 mg total) by mouth daily. Swallow whole.  Patient taking differently: Take 81 mg by mouth at bedtime. Swallow whole.   MadireddyAlean SAUNDERS, MD  Active Self, Pharmacy Records  atorvastatin  (LIPITOR) 80 MG tablet [id number] Yes Take 1 tablet (80 mg total) by mouth daily.  Patient taking differently: Take 40 mg by mouth daily. Patient states she cuts this in half and takes 40mg  and states she told the hospital that she did not like the effect when taking 80mg    Madireddy,  Alean SAUNDERS, MD  Active Self, Pharmacy Records  Calcium  Carbonate Antacid (TUMS ULTRA PO) [id number] Yes Take 1 tablet by mouth daily as needed (heartburn). [provider]  Active Self, Pharmacy Records  hydrocortisone cream (CORTIZONE-10/ALOE) 1 % [id number]  Apply 1 Application topically daily as needed for itching.  Patient not taking: Reported on 06/09/2024   [provider]  Active Self, Pharmacy Records  metoprolol  tartrate (LOPRESSOR ) 25 MG tablet [id number] Yes Take 1 tablet (25 mg total) by mouth 2 (two) times daily. Roddenberry, Myron G, PA-C  Active   traMADol  (ULTRAM ) 50 MG tablet [id number] Yes Take 1 tablet (50 mg total) by mouth every 6 (six) hours as needed for up to 7 days for moderate pain (pain score 4-6). Roddenberry, Myron G, PA-C  Active             Home Care and Equipment/Supplies: Were Home Health Services Ordered?: Yes Name of Home Health Agency:: [facility] Has Agency set up a time to come to your home?: Yes First Home Health Visit Date: 06/10/24 Any new equipment or medical supplies ordered?: No  Functional Questionnaire: Do you need assistance with bathing/showering or dressing?: Yes (daughter dried her back) Do you need assistance with meal preparation?: Yes Do you need assistance with eating?: No Do you have difficulty maintaining continence: No Do you need assistance with getting out of bed/getting out of a chair/moving?: No Do you have difficulty managing or taking your medications?: No  Follow up appointments reviewed: PCP Follow-up appointment confirmed?:  (Patient has tried to call to schedule but office is closed due to weather) Specialist Hospital Follow-up appointment confirmed?: Yes  Date of Specialist follow-up appointment?: 06/16/24 Follow-Up Specialty Provider:: Rutha Manuelita HERO, PA-C 06/16/2024; orvel [facility] 1 hour early for a chest x-ray Do you need transportation to your follow-up appointment?: No Do you understand care options  if your condition(s) worsen?: Yes-patient verbalized understanding  SDOH Interventions Today    Flowsheet Row Most Recent Value  SDOH Interventions   Food Insecurity Interventions Intervention Not Indicated  Housing Interventions Intervention Not Indicated  Transportation Interventions Intervention Not Indicated  Utilities Interventions Intervention Not Indicated    Patient declined enrollment in [facility] Program and states she has appts with doctors and will just follow up with them.  Shona Prow RN, CCM [place]  VBCI-Population Health RN Care Manager [phone]  "

## 2024-06-10 ENCOUNTER — Telehealth (HOSPITAL_COMMUNITY): Payer: Self-pay

## 2024-06-10 ENCOUNTER — Other Ambulatory Visit: Payer: Self-pay

## 2024-06-10 DIAGNOSIS — I2510 Atherosclerotic heart disease of native coronary artery without angina pectoris: Secondary | ICD-10-CM

## 2024-06-10 NOTE — Telephone Encounter (Signed)
 Phase 1 referral received and faxed to [facility].  Closed referral

## 2024-06-16 ENCOUNTER — Ambulatory Visit: Payer: Self-pay

## 2024-06-16 ENCOUNTER — Ambulatory Visit (HOSPITAL_COMMUNITY): Payer: Self-pay

## 2024-06-19 ENCOUNTER — Ambulatory Visit: Payer: Self-pay

## 2024-06-19 ENCOUNTER — Inpatient Hospital Stay: Admission: RE | Admit: 2024-06-19 | Discharge: 2024-06-19 | Payer: Self-pay

## 2024-06-19 VITALS — BP 108/65 | HR 65 | Resp 20 | Ht 61.0 in | Wt 140.0 lb

## 2024-06-19 DIAGNOSIS — Z951 Presence of aortocoronary bypass graft: Secondary | ICD-10-CM

## 2024-06-19 DIAGNOSIS — I2510 Atherosclerotic heart disease of native coronary artery without angina pectoris: Secondary | ICD-10-CM

## 2024-06-19 MED ORDER — OMEPRAZOLE 20 MG PO CPDR: 20.0000 mg | DELAYED_RELEASE_CAPSULE | Freq: Every day | ORAL | 0 refills | Status: AC

## 2024-06-19 NOTE — Progress Notes (Unsigned)
 "     [address] [place] [address]             [phone]       HPI:  Patient returns for routine postoperative follow-up having undergone Coronary artery bypass grafting x 2, Left internal mammary artery graft to the LAD, SVG to OM and endoscopic vein harvest from the right leg on 06/02/2024 with Dr. Lucas.  The patient's early postoperative recovery while in the hospital was notable for having a post operative blood loss anemia with Hgb at 8, requiring one unit of packed cells and oral iron supplementation. She was routinely diuresed. She was stable for discharge home on  06/06/2024.  Since hospital discharge the patient reports that she has been doing well.  Her pain is minimal and she is not taking any medications for it.  She has been very active with walking and house work. She does have some acid reflux which is worse in the evening time after dinner.  She has been attempting to use tums which she finds helps some.  She denies chest pain, shortness of breath and lower leg edema.    Allergies as of 06/19/2024       Reactions   Latex Dermatitis   Meperidine    Other Reaction(s): not sure what the reaction was meperidine   Morphine    Other Reaction(s): Pt thinks this made her loopy and was too strong for her morphine        Medication List        Accurate as of June 19, 2024 11:59 PM. If you have any questions, ask your nurse or doctor.          acetaminophen  325 MG tablet Commonly known as: TYLENOL  Take 2 tablets (650 mg total) by mouth every 6 (six) hours as needed.   aspirin  EC 81 MG tablet Take 1 tablet (81 mg total) by mouth daily. Swallow whole. What changed: when to take this   atorvastatin  80 MG tablet Commonly known as: LIPITOR Take 1 tablet (80 mg total) by mouth daily. What changed:  how much to take additional instructions   Cortizone-10/Aloe 1 % Generic drug: hydrocortisone cream Apply 1 Application topically  daily as needed for itching.   metoprolol  tartrate 25 MG tablet Commonly known as: LOPRESSOR  Take 1 tablet (25 mg total) by mouth 2 (two) times daily.   omeprazole  20 MG capsule Commonly known as: PRILOSEC Take 1 capsule (20 mg total) by mouth daily. Started by: Manuelita CHRISTELLA Rough   TUMS ULTRA PO Take 1 tablet by mouth daily as needed (heartburn).         ROS Review of Systems  Constitutional: Negative.  Negative for fever and malaise/fatigue.  Respiratory: Negative.  Negative for cough and shortness of breath.   Cardiovascular:  Negative for chest pain, palpitations and leg swelling.  Gastrointestinal:  Positive for heartburn. Negative for constipation, diarrhea and nausea.      BP 108/65   Pulse 65   Resp 20   Ht 5' 1 (1.549 m)   Wt 140 lb (63.5 kg)   SpO2 98% Comment: RA  BMI 26.45 kg/m    Physical Exam Constitutional:      Appearance: Normal appearance.  HENT:     Head: Normocephalic and atraumatic.  Cardiovascular:     Rate and Rhythm: Normal rate and regular rhythm.     Heart sounds: Normal heart sounds, S1 normal and S2 normal.  Pulmonary:     Effort: Pulmonary effort is normal.     Breath sounds: Normal breath sounds.  Musculoskeletal:     Cervical back: Normal range of motion.  Skin:    General: Skin is warm and dry.     Comments: Incision sites without erythema or drainage -3 sutures removed  Neurological:     General: No focal deficit present.     Mental Status: She is alert.      Imaging: EXAM: 2 VIEW(S) XRAY OF THE CHEST 06/19/2024 02:04:00 PM   COMPARISON: 06/04/2024   CLINICAL HISTORY: CABG.   FINDINGS:   LUNGS AND PLEURA: No focal pulmonary opacity. Slight blunting of posterior left costophrenic angle. No pneumothorax.   HEART AND MEDIASTINUM: Aortic atherosclerosis. CABG changes noted. No acute abnormality of the cardiac and mediastinal silhouettes.   BONES AND SOFT TISSUES: Sternotomy wires noted. Multilevel thoracic  osteophytosis. No acute osseous abnormality.   IMPRESSION: 1. Slight blunting of the posterior left costophrenic angle, which may represent a trace pleural effusion versus scarring. No pneumonia or pulmonary edema.   Electronically signed by: Rogelia Myers MD 06/19/2024 02:28 PM EST RP Workstation: [id number]   Assessment/Plan:  S/P CABG x 2 -We reviewed today's chest x ray which showed trace left sided pleural effusion. She is to continue to use her incentive spirometer -Discussed continuation of sternal precautions until a full 6 weeks from surgery and no lifting over 10 pounds until a full 3 months -She is to increase her activity as tolerated -Discussed acid reflux and prescribed omeprazole  at this time. She should discontinue tums.  She should thoroughly chew her food and stay upright for at least an hour after each meal. -Incision sites healing appropriately without signs of infection. Continue to keep clean and dry -She has follow up with cardiology on 07/09/2024 -Follow up with [facility] in 2 weeks with chest xray  Manuelita CHRISTELLA Rough, PA-C 12:56 PM 06/20/24  "

## 2024-07-03 ENCOUNTER — Ambulatory Visit

## 2024-07-09 ENCOUNTER — Ambulatory Visit
